# Patient Record
Sex: Male | Born: 1935 | Race: White | Hispanic: No | Smoking: Former smoker
Health system: Southern US, Community
[De-identification: ages and names within clinical notes are randomized; demographics above are authoritative.]

## PROBLEM LIST (undated history)

## (undated) DIAGNOSIS — I10 Essential (primary) hypertension: Secondary | ICD-10-CM

## (undated) DIAGNOSIS — L570 Actinic keratosis: Secondary | ICD-10-CM

## (undated) DIAGNOSIS — R32 Unspecified urinary incontinence: Secondary | ICD-10-CM

## (undated) DIAGNOSIS — M1990 Unspecified osteoarthritis, unspecified site: Secondary | ICD-10-CM

## (undated) DIAGNOSIS — R55 Syncope and collapse: Secondary | ICD-10-CM

## (undated) DIAGNOSIS — Z972 Presence of dental prosthetic device (complete) (partial): Secondary | ICD-10-CM

## (undated) DIAGNOSIS — Z8719 Personal history of other diseases of the digestive system: Secondary | ICD-10-CM

## (undated) DIAGNOSIS — C61 Malignant neoplasm of prostate: Secondary | ICD-10-CM

## (undated) HISTORY — PX: OTHER SURGICAL HISTORY: SHX169

## (undated) HISTORY — PX: PROSTATE SURGERY: SHX751

## (undated) HISTORY — DX: Actinic keratosis: L57.0

---

## 1998-04-08 ENCOUNTER — Encounter: Admission: RE | Admit: 1998-04-08 | Discharge: 1998-07-07 | Payer: Self-pay | Admitting: Radiation Oncology

## 1998-04-15 ENCOUNTER — Encounter: Payer: Self-pay | Admitting: Radiation Oncology

## 2003-06-10 ENCOUNTER — Ambulatory Visit (HOSPITAL_COMMUNITY): Admission: RE | Admit: 2003-06-10 | Discharge: 2003-06-10 | Payer: Self-pay | Admitting: *Deleted

## 2010-06-27 DIAGNOSIS — C4491 Basal cell carcinoma of skin, unspecified: Secondary | ICD-10-CM

## 2010-06-27 HISTORY — DX: Basal cell carcinoma of skin, unspecified: C44.91

## 2012-11-25 DIAGNOSIS — C4492 Squamous cell carcinoma of skin, unspecified: Secondary | ICD-10-CM

## 2012-11-25 HISTORY — DX: Squamous cell carcinoma of skin, unspecified: C44.92

## 2013-06-27 HISTORY — PX: COLONOSCOPY: SHX174

## 2013-08-26 ENCOUNTER — Ambulatory Visit: Payer: Self-pay | Admitting: Gastroenterology

## 2016-08-15 ENCOUNTER — Other Ambulatory Visit: Payer: Self-pay | Admitting: Gastroenterology

## 2016-08-15 DIAGNOSIS — R1310 Dysphagia, unspecified: Secondary | ICD-10-CM

## 2016-08-19 ENCOUNTER — Ambulatory Visit
Admission: RE | Admit: 2016-08-19 | Discharge: 2016-08-19 | Disposition: A | Discharge status: Home / Self Care | Payer: Medicare Other | Source: Ambulatory Visit | Attending: Gastroenterology | Admitting: Gastroenterology

## 2016-08-19 DIAGNOSIS — K449 Diaphragmatic hernia without obstruction or gangrene: Secondary | ICD-10-CM | POA: Insufficient documentation

## 2016-08-19 DIAGNOSIS — R1310 Dysphagia, unspecified: Secondary | ICD-10-CM | POA: Insufficient documentation

## 2016-08-19 DIAGNOSIS — M2578 Osteophyte, vertebrae: Secondary | ICD-10-CM | POA: Insufficient documentation

## 2016-08-19 DIAGNOSIS — K219 Gastro-esophageal reflux disease without esophagitis: Secondary | ICD-10-CM | POA: Insufficient documentation

## 2016-09-15 ENCOUNTER — Encounter: Payer: Self-pay | Admitting: Emergency Medicine

## 2016-09-15 ENCOUNTER — Emergency Department: Payer: Medicare Other

## 2016-09-15 ENCOUNTER — Emergency Department
Admission: EM | Admit: 2016-09-15 | Discharge: 2016-09-15 | Disposition: A | Discharge status: Home / Self Care | Payer: Medicare Other | Attending: Emergency Medicine | Admitting: Emergency Medicine

## 2016-09-15 DIAGNOSIS — R0602 Shortness of breath: Secondary | ICD-10-CM | POA: Diagnosis not present

## 2016-09-15 DIAGNOSIS — Z87891 Personal history of nicotine dependence: Secondary | ICD-10-CM | POA: Insufficient documentation

## 2016-09-15 DIAGNOSIS — I10 Essential (primary) hypertension: Secondary | ICD-10-CM | POA: Diagnosis not present

## 2016-09-15 DIAGNOSIS — Z79899 Other long term (current) drug therapy: Secondary | ICD-10-CM | POA: Insufficient documentation

## 2016-09-15 DIAGNOSIS — J101 Influenza due to other identified influenza virus with other respiratory manifestations: Secondary | ICD-10-CM

## 2016-09-15 DIAGNOSIS — Z8546 Personal history of malignant neoplasm of prostate: Secondary | ICD-10-CM | POA: Diagnosis not present

## 2016-09-15 DIAGNOSIS — Z7982 Long term (current) use of aspirin: Secondary | ICD-10-CM | POA: Diagnosis not present

## 2016-09-15 DIAGNOSIS — E869 Volume depletion, unspecified: Secondary | ICD-10-CM | POA: Diagnosis not present

## 2016-09-15 DIAGNOSIS — R42 Dizziness and giddiness: Secondary | ICD-10-CM

## 2016-09-15 HISTORY — DX: Unspecified urinary incontinence: R32

## 2016-09-15 HISTORY — DX: Unspecified osteoarthritis, unspecified site: M19.90

## 2016-09-15 HISTORY — DX: Malignant neoplasm of prostate: C61

## 2016-09-15 HISTORY — DX: Essential (primary) hypertension: I10

## 2016-09-15 HISTORY — DX: Syncope and collapse: R55

## 2016-09-15 LAB — CBC WITH DIFFERENTIAL/PLATELET
BASOS PCT: 0 %
Basophils Absolute: 0 10*3/uL (ref 0–0.1)
EOS ABS: 0.1 10*3/uL (ref 0–0.7)
Eosinophils Relative: 2 %
HCT: 46.1 % (ref 40.0–52.0)
HEMOGLOBIN: 15.5 g/dL (ref 13.0–18.0)
Lymphocytes Relative: 10 %
Lymphs Abs: 0.7 10*3/uL — ABNORMAL LOW (ref 1.0–3.6)
MCH: 29.6 pg (ref 26.0–34.0)
MCHC: 33.6 g/dL (ref 32.0–36.0)
MCV: 88.2 fL (ref 80.0–100.0)
Monocytes Absolute: 1 10*3/uL (ref 0.2–1.0)
Monocytes Relative: 13 %
NEUTROS PCT: 75 %
Neutro Abs: 5.7 10*3/uL (ref 1.4–6.5)
Platelets: 184 10*3/uL (ref 150–440)
RBC: 5.23 MIL/uL (ref 4.40–5.90)
RDW: 14.7 % — ABNORMAL HIGH (ref 11.5–14.5)
WBC: 7.6 10*3/uL (ref 3.8–10.6)

## 2016-09-15 LAB — COMPREHENSIVE METABOLIC PANEL
ALBUMIN: 3.7 g/dL (ref 3.5–5.0)
ALT: 22 U/L (ref 17–63)
ANION GAP: 8 (ref 5–15)
AST: 24 U/L (ref 15–41)
Alkaline Phosphatase: 57 U/L (ref 38–126)
BILIRUBIN TOTAL: 1.1 mg/dL (ref 0.3–1.2)
BUN: 14 mg/dL (ref 6–20)
CO2: 24 mmol/L (ref 22–32)
Calcium: 8.5 mg/dL — ABNORMAL LOW (ref 8.9–10.3)
Chloride: 104 mmol/L (ref 101–111)
Creatinine, Ser: 0.89 mg/dL (ref 0.61–1.24)
GFR calc Af Amer: >60 mL/min (ref >60)
GFR calc non Af Amer: >60 mL/min (ref >60)
GLUCOSE: 109 mg/dL — AB (ref 65–99)
POTASSIUM: 4 mmol/L (ref 3.5–5.1)
SODIUM: 136 mmol/L (ref 135–145)
TOTAL PROTEIN: 7.4 g/dL (ref 6.5–8.1)

## 2016-09-15 LAB — MAGNESIUM: Magnesium: 1.9 mg/dL (ref 1.7–2.4)

## 2016-09-15 LAB — INFLUENZA PANEL BY PCR (TYPE A & B)
INFLAPCR: NEGATIVE
INFLBPCR: POSITIVE — AB

## 2016-09-15 LAB — BRAIN NATRIURETIC PEPTIDE: B NATRIURETIC PEPTIDE 5: 48 pg/mL (ref 0.0–100.0)

## 2016-09-15 LAB — LIPASE, BLOOD: Lipase: 13 U/L (ref 11–51)

## 2016-09-15 LAB — TROPONIN I

## 2016-09-15 MED ORDER — OSELTAMIVIR PHOSPHATE 75 MG PO CAPS: 75.0000 mg | ORAL_CAPSULE | Freq: Two times a day (BID) | ORAL | 0 refills | Status: AC

## 2016-09-15 MED ORDER — HYDROCOD POLST-CPM POLST ER 10-8 MG/5ML PO SUER: 5.0000 mL | Freq: Two times a day (BID) | ORAL | 0 refills | Status: DC

## 2016-09-15 MED ORDER — SODIUM CHLORIDE 0.9 % IV BOLUS (SEPSIS)
500.0000 mL | INTRAVENOUS | Status: AC
  Administered 2016-09-15: 500 mL via INTRAVENOUS

## 2016-09-15 MED ORDER — ACETAMINOPHEN 500 MG PO TABS
1000.0000 mg | ORAL_TABLET | Freq: Once | ORAL | Status: AC
  Administered 2016-09-15: 1000 mg via ORAL
  Filled 2016-09-15: qty 2

## 2016-09-15 NOTE — ED Provider Notes (Signed)
[facility] Emergency Department Provider Note  ____________________________________________   None    (approximate)  I have reviewed the triage vital signs and the nursing notes.   HISTORY  Chief Complaint URI symptoms, shortness of breath, lightheadedness   HPI Trevor Carlson is a 82 y.o. male who is generally quite healthy and rarely goes to his primary care doctor, Dr. Ginette Pitman, nor the emergency department, who presents by private vehicle for evaluation of shortness of breath, cough, sinus congestion, and some dizziness starting this morning.  He states that he has felt a gradual onset of the symptoms which are moderate in severity and nothing makes them better or worse.  This onset has been over the last several days.  He has also had no appetite or interest in eating so he has had significantly decreased by mouth intake over that time.  When he tried to get up this morning and set up from his bed he became quite lightheaded and dizzy.  At that point his daughter convinced him to come to the emergency department.  He reports some "low-grade fevers" of 99-100.  He feels sinus congestion, watery eyes, runny nose, productive cough, and shortness of breath that is worse with exertion.  He denies chest pain, abdominal pain, nausea, vomiting, diarrhea, dysuria.  He has had a decreased appetite.  He is ambulatory without difficulty and no longer feels dizzy or lightheaded.    Past Medical History:  Diagnosis Date  . Arthritis   . Hypertension   . Pre-syncope   . Prostate cancer ([place])   . Urinary incontinence     There are no active problems to display for this patient.   Past Surgical History:  Procedure Laterality Date  . COLONOSCOPY  2015  . PROSTATE SURGERY      Prior to Admission medications   Not on File    Allergies Patient has no known allergies.  History reviewed. No pertinent family history.  Social History Social History  Substance Use  Topics  . Smoking status: Former Research scientist (life sciences)  . Smokeless tobacco: Never Used  . Alcohol use No    Review of Systems Constitutional: "Low grade" fevers, no chills Eyes: No visual changes. ENT: Nasal congestion, runny nose Cardiovascular: Denies chest pain. Respiratory: +shortness of breath, productive cough Gastrointestinal: No abdominal pain.  No nausea, no vomiting.  No diarrhea.  No constipation.  Decreased appetite and oral intake for several days Genitourinary: Negative for dysuria. Musculoskeletal: Negative for back pain. Skin: Negative for rash. Neurological: Negative for headaches, focal weakness or numbness.  10-point ROS otherwise negative.  ____________________________________________   PHYSICAL EXAM:  VITAL SIGNS: ED Triage Vitals  Enc Vitals Group     BP 09/15/16 0603 (!) 161/76     Pulse Rate 09/15/16 0603 (!) 115     Resp 09/15/16 0603 18     Temp 09/15/16 0603 99.4 F (37.4 C)     Temp Source 09/15/16 0603 Oral     SpO2 09/15/16 0603 96 %     Weight 09/15/16 0601 200 lb (90.7 kg)     Height 09/15/16 0601 6' (1.829 m)     Head Circumference --      Peak Flow --      Pain Score 09/15/16 0601 8     Pain Loc --      Pain Edu? --      Excl. in [place]? --     Constitutional: Alert and oriented. Well appearing and in no acute  distress. Eyes: Conjunctivae are normal. PERRL. EOMI. Head: Atraumatic. Nose: No congestion/rhinnorhea. Mouth/Throat: Mucous membranes are moist. Neck: No stridor.  No meningeal signs.   Cardiovascular: Normal rate, regular rhythm. Good peripheral circulation. Grossly normal heart sounds. Respiratory: Normal respiratory effort.  No retractions. Lungs CTAB. Gastrointestinal: Soft and nontender. No distention.  Musculoskeletal: No lower extremity tenderness nor edema. No gross deformities of extremities. Neurologic:  Normal speech and language. No gross focal neurologic deficits are appreciated.  Skin:  Skin is warm, dry and intact. No rash  noted. Psychiatric: Mood and affect are normal. Speech and behavior are normal.  ____________________________________________   LABS (all labs ordered are listed, but only abnormal results are displayed)  Labs Reviewed  CBC WITH DIFFERENTIAL/PLATELET - Abnormal; Notable for the following:       Result Value   RDW 14.7 (*)    Lymphs Abs 0.7 (*)    All other components within normal limits  COMPREHENSIVE METABOLIC PANEL - Abnormal; Notable for the following:    Glucose, Bld 109 (*)    Calcium 8.5 (*)    All other components within normal limits  TROPONIN I  MAGNESIUM  LIPASE, BLOOD  BRAIN NATRIURETIC PEPTIDE   ____________________________________________  EKG  ED ECG REPORT I, Shanetta Nicolls, the attending physician, personally viewed and interpreted this ECG.  Date: 09/15/2016 EKG Time: 6:26 AM Rate: 106 Rhythm: Sinus tachycardia QRS Axis: normal Intervals: normal ST/T Wave abnormalities: normal Conduction Disturbances: none Narrative Interpretation: Non-specific ST segment / T-wave changes, but no evidence of acute ischemia.  ____________________________________________  RADIOLOGY   No results found.  ____________________________________________   PROCEDURES  Critical Care performed: No   Procedure(s) performed:   Procedures   ____________________________________________   INITIAL IMPRESSION / ASSESSMENT AND PLAN / ED COURSE  Pertinent labs & imaging results that were available during my care of the patient were reviewed by me and considered in my medical decision making (see chart for details).  The patient is well-appearing and in no acute distress.  However, he has a very slightly elevated temperature which may be relevant for a 82 year old.  More importantly he has a heart rate of 115.  This is a man who rarely goes to the doctor, is quite stoic, and is quite healthy for his age.  He has no emergency department visits within the last 6  months.  His complaint of chest pain specifically prompts me to evaluate him further.  I suspect the tachycardia is due to volume depletion due to not eating or drinking well for the last several days in the setting of a viral illness.  However I will evaluate with a full set of labs including troponin and EKG as well as a chest x-ray to rule out any pneumonia, metabolic derangement, or ACS (rule out the best of my ability).  I will give him a small fluid bolus as I suspect that his lightheadedness and dizziness is an  orthostatic issue and he does have a history of "presyncope".  Patient and daughter agree with the plan.  Clinical Course as of Sep 15 700  Thu Sep 15, 2016  0654 CBC and EKG reassuring.  BNP, troponin, CMP, lipase, magnesium, CXR pending.  [CF]  0700 Transferring ED care to Dr. Jimmye Norman to follow up his workup as described and determine disposition.  If patient feels better after fluids with reassuring labs, anticipate discharge and outpatient follow up  [CF]    Clinical Course User Index [CF] Hinda Kehr, MD  ____________________________________________  FINAL CLINICAL IMPRESSION(S) / ED DIAGNOSES  Final diagnoses:  Viral URI with cough  Lightheadedness  Volume depletion     MEDICATIONS GIVEN DURING THIS VISIT:  Medications  sodium chloride 0.9 % bolus 500 mL (500 mLs Intravenous New Bag/Given 09/15/16 2458)     NEW OUTPATIENT MEDICATIONS STARTED DURING THIS VISIT:  New Prescriptions   No medications on file    Modified Medications   No medications on file    Discontinued Medications   No medications on file     Note:  This document was prepared using Dragon voice recognition software and may include unintentional dictation errors.    Hinda Kehr, MD 09/15/16 [phone]

## 2016-09-15 NOTE — ED Notes (Signed)
XR called to come for patient.

## 2016-09-15 NOTE — Discharge Instructions (Addendum)

## 2016-09-15 NOTE — ED Provider Notes (Signed)
Patient tested positive for influenza B. He'll be treated with Tamiflu, Tussionex and be referred for outpatient follow-up.   Earleen Newport, MD 09/15/16 [phone]

## 2016-09-15 NOTE — ED Notes (Signed)
MD in room to reassess patient.

## 2016-09-15 NOTE — ED Triage Notes (Signed)
Patient ambulatory to triage with steady gait, without difficulty or distress noted; pt reports since weekend having sinus congestion, prod cough gray sputum, sinus pressure and HA

## 2016-12-05 ENCOUNTER — Encounter: Payer: Self-pay | Admitting: *Deleted

## 2016-12-06 ENCOUNTER — Ambulatory Visit
Admission: RE | Admit: 2016-12-06 | Discharge: 2016-12-06 | Disposition: A | Discharge status: Home / Self Care | Payer: Medicare Other | Source: Ambulatory Visit | Attending: Gastroenterology | Admitting: Gastroenterology

## 2016-12-06 ENCOUNTER — Ambulatory Visit: Payer: Medicare Other | Admitting: Anesthesiology

## 2016-12-06 ENCOUNTER — Encounter
Admission: RE | Disposition: A | Discharge status: Home / Self Care | Payer: Self-pay | Source: Ambulatory Visit | Attending: Gastroenterology

## 2016-12-06 DIAGNOSIS — K449 Diaphragmatic hernia without obstruction or gangrene: Secondary | ICD-10-CM | POA: Diagnosis not present

## 2016-12-06 DIAGNOSIS — K2980 Duodenitis without bleeding: Secondary | ICD-10-CM | POA: Insufficient documentation

## 2016-12-06 DIAGNOSIS — Z7982 Long term (current) use of aspirin: Secondary | ICD-10-CM | POA: Diagnosis not present

## 2016-12-06 DIAGNOSIS — Z79899 Other long term (current) drug therapy: Secondary | ICD-10-CM | POA: Diagnosis not present

## 2016-12-06 DIAGNOSIS — R1310 Dysphagia, unspecified: Secondary | ICD-10-CM | POA: Diagnosis not present

## 2016-12-06 DIAGNOSIS — I10 Essential (primary) hypertension: Secondary | ICD-10-CM | POA: Insufficient documentation

## 2016-12-06 DIAGNOSIS — M1711 Unilateral primary osteoarthritis, right knee: Secondary | ICD-10-CM | POA: Diagnosis not present

## 2016-12-06 DIAGNOSIS — Z87891 Personal history of nicotine dependence: Secondary | ICD-10-CM | POA: Insufficient documentation

## 2016-12-06 DIAGNOSIS — Q398 Other congenital malformations of esophagus: Secondary | ICD-10-CM | POA: Insufficient documentation

## 2016-12-06 DIAGNOSIS — Z8546 Personal history of malignant neoplasm of prostate: Secondary | ICD-10-CM | POA: Insufficient documentation

## 2016-12-06 HISTORY — DX: Personal history of other diseases of the digestive system: Z87.19

## 2016-12-06 HISTORY — PX: ESOPHAGOGASTRODUODENOSCOPY (EGD) WITH PROPOFOL: SHX5813

## 2016-12-06 SURGERY — ESOPHAGOGASTRODUODENOSCOPY (EGD) WITH PROPOFOL
Anesthesia: General

## 2016-12-06 MED ORDER — LIDOCAINE 2% (20 MG/ML) 5 ML SYRINGE
INTRAMUSCULAR | Status: DC | PRN
  Administered 2016-12-06: 40 mg via INTRAVENOUS

## 2016-12-06 MED ORDER — FENTANYL CITRATE (PF) 100 MCG/2ML IJ SOLN
INTRAMUSCULAR | Status: AC
  Filled 2016-12-06: qty 2

## 2016-12-06 MED ORDER — FENTANYL CITRATE (PF) 100 MCG/2ML IJ SOLN
INTRAMUSCULAR | Status: DC | PRN
  Administered 2016-12-06 (×2): 50 ug via INTRAVENOUS

## 2016-12-06 MED ORDER — SODIUM CHLORIDE 0.9 % IV SOLN
INTRAVENOUS | Status: DC
  Administered 2016-12-06: 1000 mL via INTRAVENOUS

## 2016-12-06 MED ORDER — PROPOFOL 10 MG/ML IV BOLUS
INTRAVENOUS | Status: DC | PRN
  Administered 2016-12-06: 100 mg via INTRAVENOUS

## 2016-12-06 MED ORDER — SODIUM CHLORIDE 0.9 % IV SOLN: INTRAVENOUS | Status: DC

## 2016-12-06 MED ORDER — PROPOFOL 500 MG/50ML IV EMUL
INTRAVENOUS | Status: DC | PRN
  Administered 2016-12-06: 160 ug/kg/min via INTRAVENOUS

## 2016-12-06 MED ORDER — PROPOFOL 10 MG/ML IV BOLUS
INTRAVENOUS | Status: AC
Start: 2016-12-06 — End: 2016-12-06
  Filled 2016-12-06: qty 20

## 2016-12-06 MED ORDER — PROPOFOL 500 MG/50ML IV EMUL
INTRAVENOUS | Status: AC
  Filled 2016-12-06: qty 50

## 2016-12-06 MED ORDER — PHENYLEPHRINE HCL 10 MG/ML IJ SOLN
INTRAMUSCULAR | Status: DC | PRN
  Administered 2016-12-06: 100 ug via INTRAVENOUS

## 2016-12-06 MED ORDER — MIDAZOLAM HCL 2 MG/2ML IJ SOLN
INTRAMUSCULAR | Status: AC
  Filled 2016-12-06: qty 2

## 2016-12-06 MED ORDER — EPHEDRINE SULFATE 50 MG/ML IJ SOLN
INTRAMUSCULAR | Status: DC | PRN
  Administered 2016-12-06: 5 mg via INTRAVENOUS

## 2016-12-06 NOTE — H&P (Signed)
Outpatient short stay form Pre-procedure 12/06/2016 8:Trevor AM Lollie Sails MD  Primary Physician: Dr Tracie Harrier  Reason for visit:  EGD  History of present illness:  Patient is a 82 year old Carlson presenting today as above. He has a history of some dysphagia. He had a barium swallow recently that indicated some cervical osteophytes also a prominent B ring. His symptoms have improved while taking a proton pump inhibitor. He does take a daily aspirin that has been held for 2 days. He takes no other aspirin or blood thinning agents.    Current Facility-Administered Medications:  .  0.9 %  sodium chloride infusion, , Intravenous, Continuous, Lollie Sails, MD, Last Rate: 20 mL/hr at 12/06/16 0755, 1,000 mL at 12/06/16 0755 .  0.9 %  sodium chloride infusion, , Intravenous, Continuous, Lollie Sails, MD  Prescriptions Prior to Admission  Medication Sig Dispense Refill Last Dose  . albuterol (PROVENTIL HFA;VENTOLIN HFA) 108 (90 Base) MCG/ACT inhaler Inhale 1-2 puffs into the lungs every 6 (six) hours as needed for wheezing or shortness of breath.   Past Week at Unknown time  . aspirin EC 81 MG tablet Take 81 mg by mouth daily.   Past Week at Unknown time  . calcium citrate-vitamin D (CITRACAL+D) 315-200 MG-UNIT tablet Take 1 tablet by mouth 2 (two) times daily.   Past Week at Unknown time  . chlorpheniramine-HYDROcodone (TUSSIONEX PENNKINETIC ER) 10-8 MG/5ML SUER Take 5 mLs by mouth 2 (two) times daily. 140 mL 0 Past Week at Unknown time  . Coenzyme Q10 (CO Q-10) 100 MG CAPS Take 1 capsule by mouth daily.   Past Week at Unknown time  . glucosamine-chondroitin 500-400 MG tablet Take 1 tablet by mouth 2 (two) times daily.   Past Week at Unknown time  . losartan (COZAAR) 50 MG tablet Take 50 mg by mouth daily.   Past Week at Unknown time  . Multiple Vitamin (MULTIVITAMIN) tablet Take 1 tablet by mouth daily.   Past Week at Unknown time  . naproxen sodium (ANAPROX) 220 MG tablet Take  220 mg by mouth 2 (two) times daily with a meal.   Past Week at Unknown time  . OMEGA-3 FATTY ACIDS PO Take 1,000 mg by mouth daily.   Past Week at Unknown time  . omeprazole (PRILOSEC) 40 MG capsule Take 40 mg by mouth daily.   Past Week at Unknown time  . Plant Sterols and Stanols (CHOLESTOFF PLUS PO) Take 2 tablets by mouth 2 (two) times daily.   Past Week at Unknown time  . vitamin C (ASCORBIC ACID) 500 MG tablet Take 500 mg by mouth daily.   Past Week at Unknown time     No Known Allergies   Past Medical History:  Diagnosis Date  . Arthritis    osteoarthritis right knee  . History of hiatal hernia   . Hypertension   . Pre-syncope   . Prostate cancer ([place])   . Urinary incontinence     Review of systems:      Physical Exam    Heart and lungs: Regular rate and rhythm without rub or gallop, lungs are bilaterally clear.    HEENT: Normocephalic atraumatic eyes are anicteric    Other:     Pertinant exam for procedure: Soft nontender nondistended bowel sounds positive normoactive.    Planned proceedures: EGD and indicated procedures. I have discussed the risks benefits and complications of procedures to include not limited to bleeding, infection, perforation and the risk of sedation  and the patient wishes to proceed.    Lollie Sails, MD Gastroenterology 12/06/2016  8:Trevor AM

## 2016-12-06 NOTE — Anesthesia Postprocedure Evaluation (Signed)
Anesthesia Post Note  Patient: Trevor Carlson  Procedure(s) Performed: Procedure(s) (LRB): ESOPHAGOGASTRODUODENOSCOPY (EGD) WITH PROPOFOL (N/A)  Patient location during evaluation: PACU Anesthesia Type: General Level of consciousness: awake Pain management: pain level controlled Vital Signs Assessment: post-procedure vital signs reviewed and stable Respiratory status: spontaneous breathing Cardiovascular status: stable Anesthetic complications: no     Last Vitals:  Vitals:   12/06/16 0943 12/06/16 0953  BP: 119/68 121/74  Pulse: 69 71  Resp: 19 20  Temp:      Last Pain:  Vitals:   12/06/16 0923  TempSrc: Tympanic                 VAN STAVEREN,Ahaana Rochette

## 2016-12-06 NOTE — Anesthesia Post-op Follow-up Note (Cosign Needed)
Anesthesia QCDR form completed.        

## 2016-12-06 NOTE — Anesthesia Preprocedure Evaluation (Signed)
Anesthesia Evaluation  Patient identified by MRN, date of birth, ID band Patient awake    Reviewed: Allergy & Precautions, NPO status , Patient's Chart, lab work & pertinent test results  Airway Mallampati: II       Dental  (+) Teeth Intact   Pulmonary neg pulmonary ROS, former smoker,    breath sounds clear to auscultation       Cardiovascular Exercise Tolerance: Good hypertension, Pt. on medications  Rhythm:Regular     Neuro/Psych negative neurological ROS  negative psych ROS   GI/Hepatic Neg liver ROS, hiatal hernia,   Endo/Other  negative endocrine ROS  Renal/GU negative Renal ROS     Musculoskeletal negative musculoskeletal ROS (+)   Abdominal Normal abdominal exam  (+)   Peds negative pediatric ROS (+)  Hematology negative hematology ROS (+)   Anesthesia Other Findings   Reproductive/Obstetrics                             Anesthesia Physical Anesthesia Plan  ASA: II  Anesthesia Plan: General   Post-op Pain Management:    Induction: Intravenous  PONV Risk Score and Plan: 0  Airway Management Planned: Natural Airway  Additional Equipment:   Intra-op Plan:   Post-operative Plan:   Informed Consent: I have reviewed the patients History and Physical, chart, labs and discussed the procedure including the risks, benefits and alternatives for the proposed anesthesia with the patient or authorized representative who has indicated his/her understanding and acceptance.     Plan Discussed with: CRNA  Anesthesia Plan Comments:         Anesthesia Quick Evaluation

## 2016-12-06 NOTE — Op Note (Signed)
[facility] Gastroenterology Patient Name: Trevor Carlson Procedure Date: 12/06/2016 8:42 AM MRN: [id number] Account #: [id number] Date of Birth: 1934-08-28 Admit Type: Outpatient Age: 82 Room: [facility] ENDO ROOM 3 Gender: Male Note Status: Finalized Procedure:            Upper GI endoscopy Indications:          Dysphagia Providers:            Lollie Sails, MD Referring MD:         Tracie Harrier, MD (Referring MD) Medicines:            Monitored Anesthesia Care Complications:        No immediate complications. Procedure:            Pre-Anesthesia Assessment:                       - ASA Grade Assessment: II - A patient with mild                        systemic disease.                       After obtaining informed consent, the endoscope was                        passed under direct vision. Throughout the procedure,                        the patient's blood pressure, pulse, and oxygen                        saturations were monitored continuously. The Endoscope                        was introduced through the mouth, and advanced to the                        third part of duodenum. The upper GI endoscopy was                        accomplished without difficulty. The patient tolerated                        the procedure well. Findings:      The lower third of the esophagus was moderately tortuous.      LA Grade D (one or more mucosal breaks involving at least 75% of       esophageal circumference) esophagitis with no bleeding was found.       Biopsies were taken with a cold forceps for histology.      A small hiatal hernia was present.      Diffuse moderate inflammation characterized by congestion (edema),       erythema and friability was found in the entire examined stomach.       Biopsies were taken with a cold forceps for histology. Biopsies were       taken with a cold forceps for Helicobacter pylori testing.      The cardia and gastric fundus were  normal on retroflexion otherwise.      Diffuse and patchy mild inflammation characterized by congestion       (edema), erythema and  granularity was found in the duodenal bulb and in       the second portion of the duodenum. Impression:           - Tortuous esophagus.                       - LA Grade D erosive esophagitis. Biopsied.                       - Small hiatal hernia.                       - Bile gastritis. Biopsied.                       - Duodenitis. Recommendation:       - Use Protonix (pantoprazole) 40 mg PO BID for 6 weeks.                       - Use Protonix (pantoprazole) 40 mg PO daily daily.                       - Use sucralfate tablets 1 gram PO QID for 1 month.                       - Use sucralfate tablets 1 gram PO BID. Procedure Code(s):    --- Professional ---                       [phone], Esophagogastroduodenoscopy, flexible, transoral;                        with biopsy, single or multiple Diagnosis Code(s):    --- Professional ---                       Q39.9, Congenital malformation of esophagus, unspecified                       K20.8, Other esophagitis                       K44.9, Diaphragmatic hernia without obstruction or                        gangrene                       K29.60, Other gastritis without bleeding                       K29.80, Duodenitis without bleeding                       R13.10, Dysphagia, unspecified CPT copyright 2016 American Medical Association. All rights reserved. The codes documented in this report are preliminary and upon coder review may  be revised to meet current compliance requirements. Lollie Sails, MD 12/06/2016 9:24:06 AM This report has been signed electronically. Number of Addenda: 0 Note Initiated On: 12/06/2016 8:42 AM      [facility]

## 2016-12-06 NOTE — Transfer of Care (Signed)
Immediate Anesthesia Transfer of Care Note  Patient: Trevor Carlson  Procedure(s) Performed: Procedure(s): ESOPHAGOGASTRODUODENOSCOPY (EGD) WITH PROPOFOL (N/A)  Patient Location: PACU and Endoscopy Unit  Anesthesia Type:General  Level of Consciousness: sedated  Airway & Oxygen Therapy: Patient Spontanous Breathing and Patient connected to nasal cannula oxygen  Post-op Assessment: Report given to RN and Post -op Vital signs reviewed and stable  Post vital signs: Reviewed and stable  Last Vitals:  Vitals:   12/06/16 0744  BP: (!) 141/100  Pulse: 79  Resp: 16  Temp: 36.1 C    Last Pain: There were no vitals filed for this visit.       Complications: No apparent anesthesia complications

## 2016-12-07 ENCOUNTER — Encounter: Payer: Self-pay | Admitting: Gastroenterology

## 2016-12-08 LAB — SURGICAL PATHOLOGY

## 2017-01-09 ENCOUNTER — Encounter: Payer: Self-pay | Admitting: *Deleted

## 2017-01-10 ENCOUNTER — Ambulatory Visit: Payer: Medicare Other | Admitting: Anesthesiology

## 2017-01-10 ENCOUNTER — Encounter
Admission: RE | Disposition: A | Discharge status: Home / Self Care | Payer: Self-pay | Source: Ambulatory Visit | Attending: Gastroenterology

## 2017-01-10 ENCOUNTER — Ambulatory Visit
Admission: RE | Admit: 2017-01-10 | Discharge: 2017-01-10 | Disposition: A | Discharge status: Home / Self Care | Payer: Medicare Other | Source: Ambulatory Visit | Attending: Gastroenterology | Admitting: Gastroenterology

## 2017-01-10 ENCOUNTER — Encounter: Payer: Self-pay | Admitting: *Deleted

## 2017-01-10 DIAGNOSIS — J45909 Unspecified asthma, uncomplicated: Secondary | ICD-10-CM | POA: Diagnosis not present

## 2017-01-10 DIAGNOSIS — Z79899 Other long term (current) drug therapy: Secondary | ICD-10-CM | POA: Diagnosis not present

## 2017-01-10 DIAGNOSIS — Z7982 Long term (current) use of aspirin: Secondary | ICD-10-CM | POA: Insufficient documentation

## 2017-01-10 DIAGNOSIS — Z8546 Personal history of malignant neoplasm of prostate: Secondary | ICD-10-CM | POA: Diagnosis not present

## 2017-01-10 DIAGNOSIS — K449 Diaphragmatic hernia without obstruction or gangrene: Secondary | ICD-10-CM | POA: Diagnosis not present

## 2017-01-10 DIAGNOSIS — Z87891 Personal history of nicotine dependence: Secondary | ICD-10-CM | POA: Insufficient documentation

## 2017-01-10 DIAGNOSIS — M1711 Unilateral primary osteoarthritis, right knee: Secondary | ICD-10-CM | POA: Diagnosis not present

## 2017-01-10 DIAGNOSIS — K2950 Unspecified chronic gastritis without bleeding: Secondary | ICD-10-CM | POA: Diagnosis not present

## 2017-01-10 DIAGNOSIS — K210 Gastro-esophageal reflux disease with esophagitis: Secondary | ICD-10-CM | POA: Diagnosis not present

## 2017-01-10 DIAGNOSIS — K3189 Other diseases of stomach and duodenum: Secondary | ICD-10-CM | POA: Diagnosis not present

## 2017-01-10 DIAGNOSIS — I10 Essential (primary) hypertension: Secondary | ICD-10-CM | POA: Insufficient documentation

## 2017-01-10 HISTORY — PX: ESOPHAGOGASTRODUODENOSCOPY (EGD) WITH PROPOFOL: SHX5813

## 2017-01-10 SURGERY — ESOPHAGOGASTRODUODENOSCOPY (EGD) WITH PROPOFOL
Anesthesia: General

## 2017-01-10 MED ORDER — PROPOFOL 500 MG/50ML IV EMUL
INTRAVENOUS | Status: DC | PRN
  Administered 2017-01-10: 160 ug/kg/min via INTRAVENOUS

## 2017-01-10 MED ORDER — PROPOFOL 500 MG/50ML IV EMUL
INTRAVENOUS | Status: AC
  Filled 2017-01-10: qty 50

## 2017-01-10 MED ORDER — PROPOFOL 10 MG/ML IV BOLUS
INTRAVENOUS | Status: DC | PRN
  Administered 2017-01-10: 20 mg via INTRAVENOUS
  Administered 2017-01-10: 50 mg via INTRAVENOUS

## 2017-01-10 MED ORDER — SODIUM CHLORIDE 0.9 % IV SOLN
INTRAVENOUS | Status: DC
  Administered 2017-01-10: 1000 mL via INTRAVENOUS

## 2017-01-10 MED ORDER — SODIUM CHLORIDE 0.9 % IV SOLN: INTRAVENOUS | Status: DC

## 2017-01-10 MED ORDER — LIDOCAINE HCL (PF) 2 % IJ SOLN
INTRAMUSCULAR | Status: AC
  Filled 2017-01-10: qty 2

## 2017-01-10 MED ORDER — LIDOCAINE HCL (CARDIAC) 20 MG/ML IV SOLN
INTRAVENOUS | Status: DC | PRN
  Administered 2017-01-10: 60 mg via INTRAVENOUS

## 2017-01-10 NOTE — Transfer of Care (Addendum)
Immediate Anesthesia Transfer of Care Note  Patient: Trevor Carlson  Procedure(s) Performed: Procedure(s): ESOPHAGOGASTRODUODENOSCOPY (EGD) WITH PROPOFOL (N/A)  Patient Location: PACU  Anesthesia Type:General  Level of Consciousness: sedated  Airway & Oxygen Therapy: Patient Spontanous Breathing and Patient connected to nasal cannula oxygen  Post-op Assessment: Report given to RN and Post -op Vital signs reviewed and stable  Post vital signs: Reviewed and stable  Last Vitals:  Vitals:   01/10/17 1316 01/10/17 1530  BP: (!) 160/83 (P) 123/62  Pulse: 85 80  Resp: 20 16  Temp: (!) 35.7 C (!) (P) 36.3 C    Last Pain:  Vitals:   01/10/17 1530  TempSrc: (P) Tympanic      Patients Stated Pain Goal: 1 (07/18/22 1146)  Complications: No apparent anesthesia complications

## 2017-01-10 NOTE — Anesthesia Postprocedure Evaluation (Signed)
Anesthesia Post Note  Patient: Loron Martinique  Procedure(s) Performed: Procedure(s) (LRB): ESOPHAGOGASTRODUODENOSCOPY (EGD) WITH PROPOFOL (N/A)  Patient location during evaluation: Endoscopy Anesthesia Type: General Level of consciousness: awake and alert Pain management: pain level controlled Vital Signs Assessment: post-procedure vital signs reviewed and stable Respiratory status: spontaneous breathing, nonlabored ventilation, respiratory function stable and patient connected to nasal cannula oxygen Cardiovascular status: blood pressure returned to baseline and stable Postop Assessment: no signs of nausea or vomiting Anesthetic complications: no     Last Vitals:  Vitals:   01/10/17 1550 01/10/17 1600  BP: 133/82 (!) 157/84  Pulse: 79 84  Resp: 19 (!) 24  Temp:      Last Pain:  Vitals:   01/10/17 1530  TempSrc: Tympanic                 Ashlin Kreps S

## 2017-01-10 NOTE — Op Note (Signed)
[facility] Gastroenterology Patient Name: Trevor Carlson Procedure Date: 01/10/2017 2:51 PM MRN: [id number] Account #: [id number] Date of Birth: 1934-09-21 Admit Type: Outpatient Age: 82 Room: [facility] ENDO ROOM 3 Gender: Male Note Status: Finalized Procedure:            Upper GI endoscopy Indications:          Follow-up of reflux esophagitis Providers:            Lollie Sails, MD Referring MD:         Tracie Harrier, MD (Referring MD) Medicines:            Monitored Anesthesia Care Complications:        No immediate complications. Procedure:            Pre-Anesthesia Assessment:                       - ASA Grade Assessment: II - A patient with mild                        systemic disease.                       After obtaining informed consent, the endoscope was                        passed under direct vision. Throughout the procedure,                        the patient's blood pressure, pulse, and oxygen                        saturations were monitored continuously. The Endoscope                        was introduced through the mouth, and advanced to the                        fourth part of duodenum. The upper GI endoscopy was                        accomplished without difficulty. The patient tolerated                        the procedure well. Findings:      Mild LA Grade A (one or more mucosal breaks less than 5 mm, not       extending between tops of 2 mucosal folds) esophagitis with no bleeding       was found. Biopsies were taken with a cold forceps for histology.      No ring or stenosis.      Patchy mild inflammation characterized by congestion (edema), erosions       and erythema was found in the gastric body and in the gastric antrum.       Biopsies were taken with a cold forceps for histology. Biopsies were       taken with a cold forceps for Helicobacter pylori testing.      A small to medium hiatal hernia was found. The Z-line was a variable        distance from incisors; the hiatal hernia was sliding. Impression:           -  LA Grade A erosive esophagitis. Biopsied.                       - Erosive gastritis. Biopsied.                       - Small hiatal hernia. Recommendation:       - Await pathology results.                       - Continue present medications.                       - Return to GI clinic in 2 months.                       - Await pathology results. Procedure Code(s):    --- Professional ---                       [phone], Esophagogastroduodenoscopy, flexible, transoral;                        with biopsy, single or multiple Diagnosis Code(s):    --- Professional ---                       K20.8, Other esophagitis                       K29.60, Other gastritis without bleeding                       K44.9, Diaphragmatic hernia without obstruction or                        gangrene                       K21.0, Gastro-esophageal reflux disease with esophagitis CPT copyright 2016 American Medical Association. All rights reserved. The codes documented in this report are preliminary and upon coder review may  be revised to meet current compliance requirements. Lollie Sails, MD 01/10/2017 3:28:08 PM This report has been signed electronically. Number of Addenda: 0 Note Initiated On: 01/10/2017 2:51 PM      [facility]

## 2017-01-10 NOTE — Anesthesia Preprocedure Evaluation (Signed)
Anesthesia Evaluation  Patient identified by MRN, date of birth, ID band Patient awake    Reviewed: Allergy & Precautions  Airway Mallampati: II       Dental  (+) Teeth Intact   Pulmonary asthma , former smoker,    breath sounds clear to auscultation       Cardiovascular Exercise Tolerance: Good hypertension, Pt. on medications  Rhythm:Regular     Neuro/Psych negative neurological ROS  negative psych ROS   GI/Hepatic Neg liver ROS,   Endo/Other  negative endocrine ROS  Renal/GU negative Renal ROS     Musculoskeletal   Abdominal Normal abdominal exam  (+)   Peds  Hematology negative hematology ROS (+)   Anesthesia Other Findings   Reproductive/Obstetrics                             Anesthesia Physical Anesthesia Plan  ASA: II  Anesthesia Plan: General   Post-op Pain Management:    Induction: Intravenous  PONV Risk Score and Plan: 0  Airway Management Planned: Natural Airway and Nasal Cannula  Additional Equipment:   Intra-op Plan:   Post-operative Plan:   Informed Consent: I have reviewed the patients History and Physical, chart, labs and discussed the procedure including the risks, benefits and alternatives for the proposed anesthesia with the patient or authorized representative who has indicated his/her understanding and acceptance.     Plan Discussed with: CRNA  Anesthesia Plan Comments:         Anesthesia Quick Evaluation

## 2017-01-10 NOTE — Anesthesia Post-op Follow-up Note (Cosign Needed)
Anesthesia QCDR form completed.        

## 2017-01-10 NOTE — H&P (Signed)
Outpatient short stay form Pre-procedure 01/10/2017 2:54 PM Lollie Sails MD  Primary Physician: Dr Tracie Harrier  Reason for visit:  EGD  History of present illness:  Patient is a 82 year old male presenting today for follow-up on an EGD done 12/06/2016 for dysphagia. At that time he had a severe grade D erosive esophagitis. There is also small hiatal hernia. There is also some amount of presbyesophagus. He had some bile gastritis. He was treated with twice a day Protonix for 6 weeks and 4 times a day sucralfate for 1 month. Currently he is on Protonix 40 mg once a day and Carafate 1 g twice a day. He states that his dysphagia has resolved. He is presenting for recheck.    Current Facility-Administered Medications:  .  0.9 %  sodium chloride infusion, , Intravenous, Continuous, Lollie Sails, MD, Last Rate: 20 mL/hr at 01/10/17 1331, 1,000 mL at 01/10/17 1331 .  0.9 %  sodium chloride infusion, , Intravenous, Continuous, Lollie Sails, MD  Prescriptions Prior to Admission  Medication Sig Dispense Refill Last Dose  . aspirin EC 81 MG tablet Take 81 mg by mouth daily.   Past Week at Unknown time  . Coenzyme Q10 (CO Q-10) 100 MG CAPS Take 1 capsule by mouth daily.   Past Week at Unknown time  . glucosamine-chondroitin 500-400 MG tablet Take 1 tablet by mouth 2 (two) times daily.   Past Week at Unknown time  . losartan (COZAAR) 50 MG tablet Take 50 mg by mouth daily.   Past Week at Unknown time  . Multiple Vitamin (MULTIVITAMIN) tablet Take 1 tablet by mouth daily.   Past Week at Unknown time  . naproxen sodium (ANAPROX) 220 MG tablet Take 220 mg by mouth 2 (two) times daily with a meal.   Past Week at Unknown time  . OMEGA-3 FATTY ACIDS PO Take 1,000 mg by mouth daily.   Past Week at Unknown time  . pantoprazole (PROTONIX) 40 MG tablet Take 40 mg by mouth daily.   01/09/2017 at Unknown time  . Plant Sterols and Stanols (CHOLESTOFF PLUS PO) Take 2 tablets by mouth 2 (two) times  daily.   Past Week at Unknown time  . sucralfate (CARAFATE) 1 g tablet Take 1 g by mouth 4 (four) times daily -  with meals and at bedtime.   01/09/2017 at Unknown time  . vitamin C (ASCORBIC ACID) 500 MG tablet Take 500 mg by mouth daily.   Past Week at Unknown time  . albuterol (PROVENTIL HFA;VENTOLIN HFA) 108 (90 Base) MCG/ACT inhaler Inhale 1-2 puffs into the lungs every 6 (six) hours as needed for wheezing or shortness of breath.   Not Taking at Unknown time  . calcium citrate-vitamin D (CITRACAL+D) 315-200 MG-UNIT tablet Take 1 tablet by mouth 2 (two) times daily.   Past Week at Unknown time  . chlorpheniramine-HYDROcodone (TUSSIONEX PENNKINETIC ER) 10-8 MG/5ML SUER Take 5 mLs by mouth 2 (two) times daily. 140 mL 0 Past Week at Unknown time  . omeprazole (PRILOSEC) 40 MG capsule Take 40 mg by mouth daily.   Past Week at Unknown time     No Known Allergies   Past Medical History:  Diagnosis Date  . Arthritis    osteoarthritis right knee  . History of hiatal hernia   . Hypertension   . Pre-syncope   . Prostate cancer ([place])   . Urinary incontinence     Review of systems:      Physical Exam  Heart and lungs: Regular rate and rhythm without rub or gallop, lungs are bilaterally clear    HEENT: Normocephalic atraumatic eyes are anicteric    Other:     Pertinant exam for procedure: Soft nontender nondistended bowel sounds positive normoactive.    Planned proceedures: EGD and indicated procedures. I have discussed the risks benefits and complications of procedures to include not limited to bleeding, infection, perforation and the risk of sedation and the patient wishes to proceed.    Lollie Sails, MD Gastroenterology 01/10/2017  2:54 PM

## 2017-01-11 ENCOUNTER — Encounter: Payer: Self-pay | Admitting: Gastroenterology

## 2017-01-12 LAB — SURGICAL PATHOLOGY

## 2019-11-18 ENCOUNTER — Encounter: Payer: Self-pay | Admitting: Dermatology

## 2019-11-18 ENCOUNTER — Other Ambulatory Visit: Payer: Self-pay

## 2019-11-18 ENCOUNTER — Ambulatory Visit: Payer: Medicare Other | Admitting: Dermatology

## 2019-11-18 DIAGNOSIS — D692 Other nonthrombocytopenic purpura: Secondary | ICD-10-CM

## 2019-11-18 DIAGNOSIS — L720 Epidermal cyst: Secondary | ICD-10-CM

## 2019-11-18 DIAGNOSIS — Z85828 Personal history of other malignant neoplasm of skin: Secondary | ICD-10-CM

## 2019-11-18 DIAGNOSIS — L821 Other seborrheic keratosis: Secondary | ICD-10-CM | POA: Diagnosis not present

## 2019-11-18 DIAGNOSIS — L570 Actinic keratosis: Secondary | ICD-10-CM

## 2019-11-18 DIAGNOSIS — Z86007 Personal history of in-situ neoplasm of skin: Secondary | ICD-10-CM | POA: Diagnosis not present

## 2019-11-18 DIAGNOSIS — Z1283 Encounter for screening for malignant neoplasm of skin: Secondary | ICD-10-CM

## 2019-11-18 DIAGNOSIS — L578 Other skin changes due to chronic exposure to nonionizing radiation: Secondary | ICD-10-CM

## 2019-11-18 DIAGNOSIS — L814 Other melanin hyperpigmentation: Secondary | ICD-10-CM

## 2019-11-18 NOTE — Progress Notes (Signed)
Follow-Up Visit   Subjective  Trevor Carlson is a 85 y.o. male who presents for the following: Actinic Keratosis (21m f/u, face, ears) and hx SCCIS (L ear concha, txted 11/2012). Hx BCC L side of nose/eye.  Has bump on back to check.  Not bothersome.   The following portions of the chart were reviewed this encounter and updated as appropriate:      Review of Systems:  No other skin or systemic complaints except as noted in HPI or Assessment and Plan.  Objective  Well appearing patient in no apparent distress; mood and affect are within normal limits.  A focused examination was performed including face, ears, back, arms. Relevant physical exam findings are noted in the Assessment and Plan.  Objective  L posterior ear x 1, L ear helix x 1, R ear helix x 1, R lower cheek x 2, R nasal labial x 1, L nasal tip x 1 (7): Pink scaly macules   Objective  Right spinal upper back: 1.5cm sq nodule  Objective  Left Ear concha: Well healed scar with no evidence of recurrence.   Objective  L paranasal/medial canthus area: Large scar- clear  Objective  Right Preauricular Area: 2.0 x 1.0cm pink tan patch slightly waxy   Assessment & Plan   Actinic Damage - diffuse scaly erythematous macules with underlying dyspigmentation - Recommend daily broad spectrum sunscreen SPF 30+ to sun-exposed areas, reapply every 2 hours as needed.  - Call for new or changing lesions.  Purpura - Violaceous macules and patches - Benign - Related to age, sun damage and/or use of blood thinners - Observe - Can use OTC arnica containing moisturizer such as Dermend Bruise Formula if desired - Call for worsening or other concerns  Seborrheic Keratoses - Stuck-on, waxy, tan-brown papules and plaques  - Discussed benign etiology and prognosis. - Observe - Call for any changes  Lentigines - Scattered tan macules - Discussed due to sun exposure - Benign, observe - Call for any changes  Skin cancer  screening performed today.  AK (actinic keratosis) (7) L posterior ear x 1, L ear helix x 1, R ear helix x 1, R lower cheek x 2, R nasal labial x 1, L nasal tip x 1  Destruction of lesion - L posterior ear x 1, L ear helix x 1, R ear helix x 1, R lower cheek x 2, R nasal labial x 1, L nasal tip x 1  Destruction method: cryotherapy   Informed consent: discussed and consent obtained   Lesion destroyed using liquid nitrogen: Yes   Region frozen until ice ball extended beyond lesion: Yes   Outcome: patient tolerated procedure well with no complications   Post-procedure details: wound care instructions given    Epidermal cyst Right spinal upper back  Reassured benign growth.  Recommend observation.  Discussed surgical excision in office if changes noted or symptomatic.   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 Observe for recurrence. Call clinic for new or changing lesions.  Recommend regular skin exams, daily broad-spectrum spf 30+ sunscreen use, and photoprotection.     Hx of basal cell carcinoma L paranasal/medial canthus area  Clear. Observe for recurrence. Call clinic for new or changing lesions.  Recommend regular skin exams, daily broad-spectrum spf 30+ sunscreen use, and photoprotection.     Seborrheic keratosis Right Preauricular Area  Vs Lentigo - Benign appearing, observe  Return in about 6 months (around  05/20/2020) for Ak f/u.  I, Trevor Carlson, RMA, am acting as scribe for Trevor Patty, MD .  Documentation: I have reviewed the above documentation for accuracy and completeness, and I agree with the above.  Trevor Patty MD

## 2020-05-16 ENCOUNTER — Emergency Department: Payer: Medicare Other

## 2020-05-16 ENCOUNTER — Other Ambulatory Visit: Payer: Self-pay

## 2020-05-16 ENCOUNTER — Inpatient Hospital Stay
Admission: EM | Admit: 2020-05-16 | Discharge: 2020-05-18 | DRG: 391 | Disposition: A | Discharge status: Home / Self Care | Payer: Medicare Other | Attending: Internal Medicine | Admitting: Internal Medicine

## 2020-05-16 DIAGNOSIS — K5731 Diverticulosis of large intestine without perforation or abscess with bleeding: Secondary | ICD-10-CM | POA: Diagnosis present

## 2020-05-16 DIAGNOSIS — I700 Atherosclerosis of aorta: Secondary | ICD-10-CM | POA: Diagnosis present

## 2020-05-16 DIAGNOSIS — Z923 Personal history of irradiation: Secondary | ICD-10-CM

## 2020-05-16 DIAGNOSIS — I10 Essential (primary) hypertension: Secondary | ICD-10-CM | POA: Diagnosis present

## 2020-05-16 DIAGNOSIS — K922 Gastrointestinal hemorrhage, unspecified: Secondary | ICD-10-CM | POA: Diagnosis not present

## 2020-05-16 DIAGNOSIS — K920 Hematemesis: Secondary | ICD-10-CM | POA: Diagnosis not present

## 2020-05-16 DIAGNOSIS — K529 Noninfective gastroenteritis and colitis, unspecified: Secondary | ICD-10-CM

## 2020-05-16 DIAGNOSIS — Z8546 Personal history of malignant neoplasm of prostate: Secondary | ICD-10-CM | POA: Diagnosis not present

## 2020-05-16 DIAGNOSIS — Z9221 Personal history of antineoplastic chemotherapy: Secondary | ICD-10-CM | POA: Diagnosis not present

## 2020-05-16 DIAGNOSIS — Z87891 Personal history of nicotine dependence: Secondary | ICD-10-CM

## 2020-05-16 DIAGNOSIS — K219 Gastro-esophageal reflux disease without esophagitis: Secondary | ICD-10-CM | POA: Diagnosis present

## 2020-05-16 DIAGNOSIS — N281 Cyst of kidney, acquired: Secondary | ICD-10-CM | POA: Diagnosis present

## 2020-05-16 DIAGNOSIS — K921 Melena: Secondary | ICD-10-CM | POA: Diagnosis present

## 2020-05-16 DIAGNOSIS — I160 Hypertensive urgency: Secondary | ICD-10-CM

## 2020-05-16 DIAGNOSIS — Z79899 Other long term (current) drug therapy: Secondary | ICD-10-CM

## 2020-05-16 DIAGNOSIS — K5289 Other specified noninfective gastroenteritis and colitis: Principal | ICD-10-CM | POA: Diagnosis present

## 2020-05-16 DIAGNOSIS — R112 Nausea with vomiting, unspecified: Secondary | ICD-10-CM | POA: Diagnosis not present

## 2020-05-16 DIAGNOSIS — Z7982 Long term (current) use of aspirin: Secondary | ICD-10-CM | POA: Diagnosis not present

## 2020-05-16 DIAGNOSIS — M1711 Unilateral primary osteoarthritis, right knee: Secondary | ICD-10-CM | POA: Diagnosis present

## 2020-05-16 DIAGNOSIS — K5901 Slow transit constipation: Secondary | ICD-10-CM | POA: Diagnosis present

## 2020-05-16 DIAGNOSIS — Z20822 Contact with and (suspected) exposure to covid-19: Secondary | ICD-10-CM | POA: Diagnosis present

## 2020-05-16 DIAGNOSIS — K449 Diaphragmatic hernia without obstruction or gangrene: Secondary | ICD-10-CM | POA: Diagnosis present

## 2020-05-16 DIAGNOSIS — Z85828 Personal history of other malignant neoplasm of skin: Secondary | ICD-10-CM

## 2020-05-16 DIAGNOSIS — Z791 Long term (current) use of non-steroidal anti-inflammatories (NSAID): Secondary | ICD-10-CM

## 2020-05-16 DIAGNOSIS — K5900 Constipation, unspecified: Secondary | ICD-10-CM | POA: Diagnosis not present

## 2020-05-16 DIAGNOSIS — Z23 Encounter for immunization: Secondary | ICD-10-CM | POA: Diagnosis present

## 2020-05-16 LAB — RESP PANEL BY RT-PCR (FLU A&B, COVID) ARPGX2
Influenza A by PCR: NEGATIVE
Influenza B by PCR: NEGATIVE
SARS Coronavirus 2 by RT PCR: NEGATIVE

## 2020-05-16 LAB — COMPREHENSIVE METABOLIC PANEL
ALT: 14 U/L (ref 0–44)
AST: 21 U/L (ref 15–41)
Albumin: 3.9 g/dL (ref 3.5–5.0)
Alkaline Phosphatase: 62 U/L (ref 38–126)
Anion gap: 9 (ref 5–15)
BUN: 16 mg/dL (ref 8–23)
CO2: 24 mmol/L (ref 22–32)
Calcium: 8.6 mg/dL — ABNORMAL LOW (ref 8.9–10.3)
Chloride: 105 mmol/L (ref 98–111)
Creatinine, Ser: 0.93 mg/dL (ref 0.61–1.24)
GFR, Estimated: >60 mL/min (ref >60)
Glucose, Bld: 84 mg/dL (ref 70–99)
Potassium: 4.4 mmol/L (ref 3.5–5.1)
Sodium: 138 mmol/L (ref 135–145)
Total Bilirubin: 0.8 mg/dL (ref 0.3–1.2)
Total Protein: 7.1 g/dL (ref 6.5–8.1)

## 2020-05-16 LAB — TYPE AND SCREEN
ABO/RH(D): O POS
Antibody Screen: NEGATIVE

## 2020-05-16 LAB — CBC
HCT: 44.3 % (ref 39.0–52.0)
Hemoglobin: 15 g/dL (ref 13.0–17.0)
MCH: 30.4 pg (ref 26.0–34.0)
MCHC: 33.9 g/dL (ref 30.0–36.0)
MCV: 89.9 fL (ref 80.0–100.0)
Platelets: 230 10*3/uL (ref 150–400)
RBC: 4.93 MIL/uL (ref 4.22–5.81)
RDW: 14.1 % (ref 11.5–15.5)
WBC: 6.7 10*3/uL (ref 4.0–10.5)
nRBC: 0 % (ref 0.0–0.2)

## 2020-05-16 LAB — PROTIME-INR
INR: 1 (ref 0.8–1.2)
Prothrombin Time: 12.7 seconds (ref 11.4–15.2)

## 2020-05-16 LAB — APTT: aPTT: 29 seconds (ref 24–36)

## 2020-05-16 MED ORDER — SUCRALFATE 1 G PO TABS
1.0000 g | ORAL_TABLET | Freq: Three times a day (TID) | ORAL | Status: DC
  Administered 2020-05-17 – 2020-05-18 (×4): 1 g via ORAL
  Filled 2020-05-16 (×5): qty 1

## 2020-05-16 MED ORDER — MORPHINE SULFATE (PF) 2 MG/ML IV SOLN: 1.0000 mg | INTRAVENOUS | Status: DC | PRN

## 2020-05-16 MED ORDER — PLANT STEROLS AND STANOLS 450 MG PO CAPS: ORAL_CAPSULE | Freq: Two times a day (BID) | ORAL | Status: DC

## 2020-05-16 MED ORDER — ONDANSETRON HCL 4 MG/2ML IJ SOLN
4.0000 mg | Freq: Four times a day (QID) | INTRAMUSCULAR | Status: DC | PRN
  Administered 2020-05-17: 4 mg via INTRAVENOUS
  Filled 2020-05-16 (×2): qty 2

## 2020-05-16 MED ORDER — ACETAMINOPHEN 325 MG PO TABS: 650.0000 mg | ORAL_TABLET | Freq: Four times a day (QID) | ORAL | Status: DC | PRN

## 2020-05-16 MED ORDER — GLUCOSAMINE-CHONDROITIN 500-400 MG PO TABS: 1.0000 | ORAL_TABLET | Freq: Two times a day (BID) | ORAL | Status: DC

## 2020-05-16 MED ORDER — CALCIUM CITRATE-VITAMIN D 500-500 MG-UNIT PO CHEW
1.0000 | CHEWABLE_TABLET | Freq: Two times a day (BID) | ORAL | Status: DC
  Administered 2020-05-17 – 2020-05-18 (×2): 1 via ORAL
  Filled 2020-05-16 (×5): qty 1

## 2020-05-16 MED ORDER — SODIUM CHLORIDE 0.9 % IV SOLN: INTRAVENOUS | Status: DC

## 2020-05-16 MED ORDER — OMEGA-3-ACID ETHYL ESTERS 1 G PO CAPS
1000.0000 mg | ORAL_CAPSULE | Freq: Every day | ORAL | Status: DC
  Administered 2020-05-18: 1000 mg via ORAL
  Filled 2020-05-16: qty 1

## 2020-05-16 MED ORDER — IOHEXOL 350 MG/ML SOLN
100.0000 mL | Freq: Once | INTRAVENOUS | Status: AC | PRN
  Administered 2020-05-16: 100 mL via INTRAVENOUS

## 2020-05-16 MED ORDER — ASCORBIC ACID 500 MG PO TABS
500.0000 mg | ORAL_TABLET | Freq: Every day | ORAL | Status: DC
  Administered 2020-05-18: 500 mg via ORAL
  Filled 2020-05-16: qty 1

## 2020-05-16 MED ORDER — TRAZODONE HCL 50 MG PO TABS: 25.0000 mg | ORAL_TABLET | Freq: Every evening | ORAL | Status: DC | PRN

## 2020-05-16 MED ORDER — ONDANSETRON HCL 4 MG PO TABS
4.0000 mg | ORAL_TABLET | Freq: Four times a day (QID) | ORAL | Status: DC | PRN
  Administered 2020-05-17: 4 mg via ORAL
  Filled 2020-05-16: qty 1

## 2020-05-16 MED ORDER — ACETAMINOPHEN 650 MG RE SUPP: 650.0000 mg | Freq: Four times a day (QID) | RECTAL | Status: DC | PRN

## 2020-05-16 MED ORDER — HYDROCOD POLST-CPM POLST ER 10-8 MG/5ML PO SUER
5.0000 mL | Freq: Two times a day (BID) | ORAL | Status: DC
  Administered 2020-05-16 – 2020-05-18 (×4): 5 mL via ORAL
  Filled 2020-05-16 (×4): qty 5

## 2020-05-16 MED ORDER — PANTOPRAZOLE SODIUM 40 MG IV SOLR
40.0000 mg | Freq: Two times a day (BID) | INTRAVENOUS | Status: DC
  Administered 2020-05-16 – 2020-05-18 (×4): 40 mg via INTRAVENOUS
  Filled 2020-05-16 (×4): qty 40

## 2020-05-16 MED ORDER — ADULT MULTIVITAMIN W/MINERALS CH
1.0000 | ORAL_TABLET | Freq: Every day | ORAL | Status: DC
  Administered 2020-05-18: 1 via ORAL
  Filled 2020-05-16: qty 1

## 2020-05-16 MED ORDER — ALBUTEROL SULFATE (2.5 MG/3ML) 0.083% IN NEBU: 2.5000 mg | INHALATION_SOLUTION | Freq: Four times a day (QID) | RESPIRATORY_TRACT | Status: DC | PRN

## 2020-05-16 MED ORDER — LOSARTAN POTASSIUM 50 MG PO TABS
50.0000 mg | ORAL_TABLET | Freq: Every day | ORAL | Status: DC
  Administered 2020-05-17 – 2020-05-18 (×2): 50 mg via ORAL
  Filled 2020-05-16 (×2): qty 1

## 2020-05-16 NOTE — ED Triage Notes (Signed)
Pt c/o of rectal bleeding. Pt states blood is bright red with clots. Pt has hx of cancer and chemo and radiation.

## 2020-05-16 NOTE — ED Provider Notes (Signed)
[facility] Emergency Department Provider Note  ____________________________________________  Time seen: Approximately 7:41 PM  I have reviewed the triage vital signs and the nursing notes.   HISTORY  Chief Complaint Rectal Bleeding    HPI Trevor Carlson is a 85 y.o. male who presents the emergency department complaining of bright red rectal bleeding. Patient states that this afternoon he went to the bathroom, saw a large amount of bright red blood. He has had no abdominal pain. No chronic GI complaints. Patient states that he has had hemorrhoids in the past but he has never had bleeding like this and typically has significant pain. No pain associated with this blood. He denies any nausea, vomiting, diarrhea or constipation. Again no chronic GI complaints such as Crohn's, colitis, diverticulosis or diverticulitis. Patient states that he takes a daily aspirin but does not take a blood thinner. Patient does have a history of hiatal hernia, hypertension, prostate cancer, arthritis.         Past Medical History:  Diagnosis Date  . Actinic keratosis   . Arthritis    osteoarthritis right knee  . Basal cell carcinoma 06/2010   L nasal dorsum/med canthus, txted by Dr. Lacinda Axon, Mohs  . History of hiatal hernia   . Hypertension   . Pre-syncope   . Prostate cancer ([place])   . Squamous cell carcinoma of skin 11/2012   SCC IS L ear concha  . Urinary incontinence     There are no problems to display for this patient.   Past Surgical History:  Procedure Laterality Date  . COLONOSCOPY  2015  . dental implants    . ESOPHAGOGASTRODUODENOSCOPY (EGD) WITH PROPOFOL N/A 12/06/2016   Procedure: ESOPHAGOGASTRODUODENOSCOPY (EGD) WITH PROPOFOL;  Surgeon: Lollie Sails, MD;  [facility] ENDOSCOPY;  Service: Endoscopy;  Laterality: N/A;  . ESOPHAGOGASTRODUODENOSCOPY (EGD) WITH PROPOFOL N/A 01/10/2017   Procedure: ESOPHAGOGASTRODUODENOSCOPY (EGD) WITH PROPOFOL;  Surgeon:  Lollie Sails, MD;  Location: [facility] ENDOSCOPY;  Service: Endoscopy;  Laterality: N/A;  . PROSTATE SURGERY      Prior to Admission medications   Medication Sig Start Date End Date Taking? Authorizing Provider  albuterol (PROVENTIL HFA;VENTOLIN HFA) 108 (90 Base) MCG/ACT inhaler Inhale 1-2 puffs into the lungs every 6 (six) hours as needed for wheezing or shortness of breath.    [provider]  aspirin EC 81 MG tablet Take 81 mg by mouth daily.    [provider]  calcium citrate-vitamin D (CITRACAL+D) 315-200 MG-UNIT tablet Take 1 tablet by mouth 2 (two) times daily.    [provider]  chlorpheniramine-HYDROcodone (TUSSIONEX PENNKINETIC ER) 10-8 MG/5ML SUER Take 5 mLs by mouth 2 (two) times daily. 09/15/16   Earleen Newport, MD  Coenzyme Q10 (CO Q-10) 100 MG CAPS Take 1 capsule by mouth daily.    [provider]  glucosamine-chondroitin 500-400 MG tablet Take 1 tablet by mouth 2 (two) times daily.    [provider]  losartan (COZAAR) 50 MG tablet Take 50 mg by mouth daily.    [provider]  Multiple Vitamin (MULTIVITAMIN) tablet Take 1 tablet by mouth daily.    [provider]  naproxen sodium (ANAPROX) 220 MG tablet Take 220 mg by mouth 2 (two) times daily with a meal.    [provider]  OMEGA-3 FATTY ACIDS PO Take 1,000 mg by mouth daily.    [provider]  omeprazole (PRILOSEC) 40 MG capsule Take 40 mg by mouth daily.    [provider]  pantoprazole (PROTONIX) 40 MG tablet Take 40 mg by mouth daily.    [provider]  Plant Sterols and Stanols (CHOLESTOFF PLUS PO) Take 2 tablets by mouth 2 (two) times daily.    [provider]  sucralfate (CARAFATE) 1 g tablet Take 1 g by mouth 4 (four) times daily -  with meals and at bedtime.    [provider]  vitamin C (ASCORBIC ACID) 500 MG tablet Take 500 mg by mouth daily.    [provider]     Allergies Patient has no known allergies.  No family history on file.  Social History Social History   Tobacco Use  . Smoking status: Former Smoker    Packs/day: 1.00    Years: 35.00    Pack years: 35.00    Types: Cigarettes    Quit date: 03/26/1974    Years since quitting: 46.1  . Smokeless tobacco: Former Systems developer    Quit date: 03/27/1979  Substance Use Topics  . Alcohol use: No  . Drug use: Never     Review of Systems  Constitutional: No fever/chills Eyes: No visual changes. No discharge ENT: No upper respiratory complaints. Cardiovascular: no chest pain. Respiratory: no cough. No SOB. Gastrointestinal: Bright red rectal bleeding. No abdominal pain.  No nausea, no vomiting.  No diarrhea.  No constipation. Genitourinary: Negative for dysuria. No hematuria Musculoskeletal: Negative for musculoskeletal pain. Skin: Negative for rash, abrasions, lacerations, ecchymosis. Neurological: Negative for headaches, focal weakness or numbness.  10 System ROS otherwise negative.  ____________________________________________   PHYSICAL EXAM:  VITAL SIGNS: ED Triage Vitals  Enc Vitals Group     BP 05/16/20 1905 (!) 159/111     Pulse Rate 05/16/20 1905 79     Resp 05/16/20 1905 18     Temp 05/16/20 1905 97.8 F (36.6 C)     Temp Source 05/16/20 1905 Oral     SpO2 05/16/20 1905 99 %     Weight 05/16/20 1904 195 lb (88.5 kg)     Height 05/16/20 1904 6' (1.829 m)     Head Circumference --      Peak Flow --      Pain Score --      Pain Loc --      Pain Edu? --      Excl. in [place]? --      Constitutional: Alert and oriented. Well appearing and in no acute distress. Eyes: Conjunctivae are normal. PERRL. EOMI. Head: Atraumatic. ENT:      Ears:       Nose: No congestion/rhinnorhea.      Mouth/Throat: Mucous membranes are moist.  Neck: No stridor.    Cardiovascular: Normal rate, regular rhythm. Normal S1 and S2.  Good peripheral circulation. Respiratory: Normal  respiratory effort without tachypnea or retractions. Lungs CTAB. Good air entry to the bases with no decreased or absent breath sounds. Gastrointestinal: Bowel sounds 4 quadrants. No visible external abdominal findings. Soft and nontender to palpation in all quadrants.. No guarding or rigidity. No palpable masses. No distention. No CVA tenderness. Musculoskeletal: Full range of motion to all extremities. No gross deformities appreciated. Neurologic:  Normal speech and language. No gross focal neurologic deficits are appreciated.  Skin:  Skin is warm, dry and intact. No rash noted. Psychiatric: Mood and affect are normal. Speech and behavior are normal. Patient exhibits appropriate insight and judgement.   ____________________________________________   LABS (all labs ordered are listed, but only abnormal results are displayed)  Labs  Reviewed  COMPREHENSIVE METABOLIC PANEL - Abnormal; Notable for the following components:      Result Value   Calcium 8.6 (*)    All other components within normal limits  CBC  PROTIME-INR  APTT  POC OCCULT BLOOD, ED  TYPE AND SCREEN   ____________________________________________  EKG   ____________________________________________  RADIOLOGY I personally viewed and evaluated these images as part of my medical decision making, as well as reviewing the written report by the radiologist.  ED Provider Interpretation: Proctocolitis, constipation with out acute source of bleeding identified on CT scan.  CT Angio Abd/Pel w/ and/or w/o  Result Date: 05/16/2020 CLINICAL DATA:  Melena rectal bleeding EXAM: CTA ABDOMEN AND PELVIS WITHOUT AND WITH CONTRAST TECHNIQUE: Multidetector CT imaging of the abdomen and pelvis was performed using the standard protocol during bolus administration of intravenous contrast. Multiplanar reconstructed images and MIPs were obtained and reviewed to evaluate the vascular anatomy. CONTRAST:  199mL OMNIPAQUE IOHEXOL 350 MG/ML SOLN  COMPARISON:  None. FINDINGS: CTA ABDOMEN AND PELVIS FINDINGS VASCULAR Aorta: Normal caliber aorta without aneurysm, dissection, vasculitis or hemodynamically significant stenosis. There is moderate to advanced and noncalcified aortic atherosclerosis. Celiac: There is mild stenosis seen at the origin of the celiac due to atherosclerosis. SMA: There is mild stenosis at the origin of the SMA due to atherosclerosis. Renals: Single renal arteries bilaterally. No aneurysm, dissection, stenosis or evidence of fibromuscular dysplasia. Mild atherosclerosis in the renal arteries. IMA: Patent without abnormality. Inflow: Moderate atherosclerosis seen in the iliacs. Veins: Normal course and caliber of the major veins. Assessment is otherwise limited by the arterial dominant contrast phase. Review of the MIP images confirms the above findings. NON-VASCULAR Hepatobiliary: Multiple low-density lesions are seen throughout the liver parenchyma. The largest within the anterior right liver lobe measuring cm. No visible biliary dilatation. Normal gallbladder. Pancreas: Normal contours without ductal dilatation. No peripancreatic fluid collection. Spleen: Normal arterial phase splenic enhancement pattern. Adrenals/Urinary Tract: --Adrenal glands: Normal. Kidneys: Multiple bilateral low-density lesions are seen throughout both kidneys with probable left-sided parapelvic cysts. No renal or collecting system calculi. No hydronephrosis.--Urinary bladder: Unremarkable. Stomach/Bowel: --Stomach/Duodenum: No hiatal hernia or other gastric abnormality. Normal duodenal course and caliber. --Small bowel: No dilatation or inflammation. --Colon: There is a large amount of stool seen throughout with diverticulosis. There appears to be wall thickening seen around the rectum with perirectal edema. Lymphatic:  No abdominal or pelvic lymphadenopathy. Reproductive: No free fluid in the pelvis. Musculoskeletal. No bony spinal canal stenosis or focal  osseous abnormality. A chronic compression deformity of the T12 vertebral body. Other: None. Review of the MIP images confirms the above findings. IMPRESSION: VASCULAR Moderate to advanced aortic atherosclerosis. No acute aortic abnormality. Mild stenosis at the origin of the SMA and celiac axis due to atherosclerosis. NON-VASCULAR Findings which could be suggestive of mild proctocolitis. Moderate to large amount of colonic stool present throughout. Multiple hepatic and renal cysts. Electronically Signed   By: Prudencio Pair M.D.   On: 05/16/2020 21:00    ____________________________________________    PROCEDURES  Procedure(s) performed:    Procedures    Medications  iohexol (OMNIPAQUE) 350 MG/ML injection 100 mL (100 mLs Intravenous Contrast Given 05/16/20 2022)     ____________________________________________   INITIAL IMPRESSION / ASSESSMENT AND PLAN / ED COURSE  Pertinent labs & imaging results that were available during my care of the patient were reviewed by me and considered in my medical decision making (see chart for details).  Review of the [place] CSRS was  performed in accordance of the [place] prior to dispensing any controlled drugs.           Patient's diagnosis is consistent with Proctocolitis, GI bleeding, constipation.  Patient presented to emergency department complaining of rectal bleeding starting today.  Patient states that he is having bright red blood per rectum.  He does have a history of hemorrhoids but has never had this amount of blood before.  He denied any rectal pain or abdominal pain.  No chronic GI complaints.  Patient did have a bowel movement with frank blood identified in the emergency department.  This was bright red.  I believe the patient's source is likely lower GI bleeding.  Patient had labs with reassuring H&H currently.  Patient with reassuring abdominal exam with no tenderness or other acute findings on exam.  Imaging revealed proctocolitis,  constipation but no other acute source of GI bleeding.  This time patient's vitals are stable, labs are stable as well as presentation stable.  Discussed the results with the patient.  Patient would prefer to be admitted to be observed overnight to ensure stable labs.  I feel this is reasonable and hospital service will be contacted for admission for GI bleeding.  Hospitalist service agrees to admit the patient and patient care will be transferred to the hospitalist team at this time.     ____________________________________________  FINAL CLINICAL IMPRESSION(S) / ED DIAGNOSES  Final diagnoses:  Lower GI bleed  Slow transit constipation      NEW MEDICATIONS STARTED DURING THIS VISIT:  ED Discharge Orders    None          This chart was dictated using voice recognition software/Dragon. Despite best efforts to proofread, errors can occur which can change the meaning. Any change was purely unintentional.    Darletta Moll, PA-C 05/16/20 2211    Nance Pear, MD 05/17/20 1505

## 2020-05-16 NOTE — Progress Notes (Signed)
PHARMACIST - PHYSICIAN ORDER COMMUNICATION  CONCERNING: P&T Medication Policy on Herbal Medications  DESCRIPTION:  This patient's order for:  Glucosamine-chondroitin capsules,  Plant sterols and stanols  has been noted.  This product(s) is classified as an "herbal" or natural product. Due to a lack of definitive safety studies or FDA approval, nonstandard manufacturing practices, plus the potential risk of unknown drug-drug interactions while on inpatient medications, the Pharmacy and Therapeutics Committee does not permit the use of "herbal" or natural products of this type within [facility].   ACTION TAKEN: The pharmacy department is unable to verify this order at this time and your patient has been informed of this safety policy. Please reevaluate patient's clinical condition at discharge and address if the herbal or natural product(s) should be resumed at that time.

## 2020-05-16 NOTE — H&P (Signed)
[facility]   PATIENT NAME: Trevor Carlson    MR#:  [id number]  DATE OF BIRTH:  08/18/1934  DATE OF ADMISSION:  05/16/2020  PRIMARY CARE PHYSICIAN: Tracie Harrier, MD   REQUESTING/REFERRING PHYSICIAN: Cuthriell, Charline Bills, PA-C  CHIEF COMPLAINT:   Chief Complaint  Patient presents with  . Rectal Bleeding    HISTORY OF PRESENT ILLNESS:  Trevor Carlson  is a 85 y.o. Caucasian male with a known history of hypertension, GERD and prostate cancer, who presented to the emergency room with acute onset of bright red bleeding per rectum that occurred around 6 PM recurred 2-3 times.  He denied any melanotic stools.  He denied any abdominal pain or nausea or vomiting.  No fever or chills.  No cough or wheezing or dyspnea.  No other bleeding diathesis.  No dysuria, oliguria or hematuria or flank pain.  He denies any chest pain or palpitations.  Upon presentation to the emergency room, blood pressure was 159/111 with otherwise normal vital signs.  Labs revealed unremarkable CMP.  CBC showed hemoglobin of 15 hematocrit 44.3.  PT was 12.7 INR 1.  Influenza antigens and COVID-19 PCR came back negative.  CT angio of the abdomen and pelvis showed moderate to advanced aortic atherosclerosis, mild proctocolitis, moderate to large amount of colonic stool present throughout and multiple hepatic and renal cysts.  The patient will be admitted to a medical monitored bed for further evaluation and management.  PAST MEDICAL HISTORY:   Past Medical History:  Diagnosis Date  . Actinic keratosis   . Arthritis    osteoarthritis right knee  . Basal cell carcinoma 06/2010   L nasal dorsum/med canthus, txted by Dr. Lacinda Axon, Mohs  . History of hiatal hernia   . Hypertension   . Pre-syncope   . Prostate cancer ([place])   . Squamous cell carcinoma of skin 11/2012   SCC IS L ear concha  . Urinary incontinence     PAST SURGICAL HISTORY:   Past Surgical History:  Procedure Laterality Date  . COLONOSCOPY  2015   . dental implants    . ESOPHAGOGASTRODUODENOSCOPY (EGD) WITH PROPOFOL N/A 12/06/2016   Procedure: ESOPHAGOGASTRODUODENOSCOPY (EGD) WITH PROPOFOL;  Surgeon: Lollie Sails, MD;  Location: [facility] ENDOSCOPY;  Service: Endoscopy;  Laterality: N/A;  . ESOPHAGOGASTRODUODENOSCOPY (EGD) WITH PROPOFOL N/A 01/10/2017   Procedure: ESOPHAGOGASTRODUODENOSCOPY (EGD) WITH PROPOFOL;  Surgeon: Lollie Sails, MD;  Location: [facility] ENDOSCOPY;  Service: Endoscopy;  Laterality: N/A;  . PROSTATE SURGERY      SOCIAL HISTORY:   Social History   Tobacco Use  . Smoking status: Former Smoker    Packs/day: 1.00    Years: 35.00    Pack years: 35.00    Types: Cigarettes    Quit date: 03/26/1974    Years since quitting: 46.1  . Smokeless tobacco: Former Systems developer    Quit date: 03/27/1979  Substance Use Topics  . Alcohol use: No    FAMILY HISTORY:  No family history on file.  DRUG ALLERGIES:  No Known Allergies  REVIEW OF SYSTEMS:   ROS As per history of present illness. All pertinent systems were reviewed above. Constitutional, HEENT, cardiovascular, respiratory, GI, GU, musculoskeletal, neuro, psychiatric, endocrine, integumentary and hematologic systems were reviewed and are otherwise negative/unremarkable except for positive findings mentioned above in the HPI.   MEDICATIONS AT HOME:   Prior to Admission medications   Medication Sig Start Date End Date Taking? Authorizing Provider  albuterol (PROVENTIL HFA;VENTOLIN HFA) 108 (90 Base)  MCG/ACT inhaler Inhale 1-2 puffs into the lungs every 6 (six) hours as needed for wheezing or shortness of breath.   Yes [provider]  aspirin EC 81 MG tablet Take 81 mg by mouth daily.   Yes [provider]  calcium citrate-vitamin D (CITRACAL+D) 315-200 MG-UNIT tablet Take 1 tablet by mouth 2 (two) times daily.   Yes [provider]  Coenzyme Q10 (CO Q-10) 100 MG CAPS Take 1 capsule by mouth daily.   Yes [provider]   glucosamine-chondroitin 500-400 MG tablet Take 1 tablet by mouth 2 (two) times daily.   Yes [provider]  losartan (COZAAR) 50 MG tablet Take 50 mg by mouth daily.   Yes [provider]  Multiple Vitamin (MULTIVITAMIN) tablet Take 1 tablet by mouth daily.   Yes [provider]  naproxen sodium (ANAPROX) 220 MG tablet Take 220 mg by mouth 2 (two) times daily with a meal.   Yes [provider]  OMEGA-3 FATTY ACIDS PO Take 1,000 mg by mouth daily.   Yes [provider]  omeprazole (PRILOSEC) 40 MG capsule Take 40 mg by mouth daily.   Yes [provider]  Plant Sterols and Stanols (CHOLESTOFF PLUS PO) Take 2 tablets by mouth 2 (two) times daily.   Yes [provider]  vitamin C (ASCORBIC ACID) 500 MG tablet Take 500 mg by mouth daily.   Yes [provider]      VITAL SIGNS:  Blood pressure (!) 168/86, pulse 70, temperature 97.8 F (36.6 C), temperature source Oral, resp. rate 18, height 6' (1.829 m), weight 88.5 kg, SpO2 100 %.  PHYSICAL EXAMINATION:  Physical Exam  GENERAL:  85 y.o.-year-old Caucasian male patient lying in the bed with no acute distress.  EYES: Pupils equal, round, reactive to light and accommodation. No scleral icterus. Extraocular muscles intact.  HEENT: Head atraumatic, normocephalic. Oropharynx and nasopharynx clear.  NECK:  Supple, no jugular venous distention. No thyroid enlargement, no tenderness.  LUNGS: Normal breath sounds bilaterally, no wheezing, rales,rhonchi or crepitation. No use of accessory muscles of respiration.  CARDIOVASCULAR: Regular rate and rhythm, S1, S2 normal. No murmurs, rubs, or gallops.  ABDOMEN: Soft, nondistended, nontender. Bowel sounds present. No organomegaly or mass.  EXTREMITIES: No pedal edema, cyanosis, or clubbing.  NEUROLOGIC: Cranial nerves II through XII are intact. Muscle strength 5/5 in all extremities. Sensation intact. Gait not checked.  PSYCHIATRIC: The  patient is alert and oriented x 3.  Normal affect and good eye contact. SKIN: No obvious rash, lesion, or ulcer.   LABORATORY PANEL:   CBC Recent Labs  Lab 05/16/20 1910  WBC 6.7  HGB 15.0  HCT 44.3  PLT 230   ------------------------------------------------------------------------------------------------------------------  Chemistries  Recent Labs  Lab 05/16/20 1910  NA 138  K 4.4  CL 105  CO2 24  GLUCOSE 84  BUN 16  CREATININE 0.93  CALCIUM 8.6*  AST 21  ALT 14  ALKPHOS 62  BILITOT 0.8   ------------------------------------------------------------------------------------------------------------------  Cardiac Enzymes No results for input(s): TROPONINI in the last 168 hours. ------------------------------------------------------------------------------------------------------------------  RADIOLOGY:  CT Angio Abd/Pel w/ and/or w/o  Result Date: 05/16/2020 CLINICAL DATA:  Melena rectal bleeding EXAM: CTA ABDOMEN AND PELVIS WITHOUT AND WITH CONTRAST TECHNIQUE: Multidetector CT imaging of the abdomen and pelvis was performed using the standard protocol during bolus administration of intravenous contrast. Multiplanar reconstructed images and MIPs were obtained and reviewed to evaluate the vascular anatomy. CONTRAST:  167mL OMNIPAQUE IOHEXOL 350 MG/ML SOLN COMPARISON:  None. FINDINGS: CTA ABDOMEN AND PELVIS FINDINGS VASCULAR Aorta: Normal caliber aorta without aneurysm, dissection, vasculitis or hemodynamically significant stenosis. There is moderate to advanced and noncalcified aortic atherosclerosis. Celiac: There is mild stenosis seen at the origin of the celiac due to atherosclerosis. SMA: There is mild stenosis at the origin of the SMA due to atherosclerosis. Renals: Single renal arteries bilaterally. No aneurysm, dissection, stenosis or evidence of fibromuscular dysplasia. Mild atherosclerosis in the renal arteries. IMA: Patent without abnormality. Inflow: Moderate  atherosclerosis seen in the iliacs. Veins: Normal course and caliber of the major veins. Assessment is otherwise limited by the arterial dominant contrast phase. Review of the MIP images confirms the above findings. NON-VASCULAR Hepatobiliary: Multiple low-density lesions are seen throughout the liver parenchyma. The largest within the anterior right liver lobe measuring cm. No visible biliary dilatation. Normal gallbladder. Pancreas: Normal contours without ductal dilatation. No peripancreatic fluid collection. Spleen: Normal arterial phase splenic enhancement pattern. Adrenals/Urinary Tract: --Adrenal glands: Normal. Kidneys: Multiple bilateral low-density lesions are seen throughout both kidneys with probable left-sided parapelvic cysts. No renal or collecting system calculi. No hydronephrosis.--Urinary bladder: Unremarkable. Stomach/Bowel: --Stomach/Duodenum: No hiatal hernia or other gastric abnormality. Normal duodenal course and caliber. --Small bowel: No dilatation or inflammation. --Colon: There is a large amount of stool seen throughout with diverticulosis. There appears to be wall thickening seen around the rectum with perirectal edema. Lymphatic:  No abdominal or pelvic lymphadenopathy. Reproductive: No free fluid in the pelvis. Musculoskeletal. No bony spinal canal stenosis or focal osseous abnormality. A chronic compression deformity of the T12 vertebral body. Other: None. Review of the MIP images confirms the above findings. IMPRESSION: VASCULAR Moderate to advanced aortic atherosclerosis. No acute aortic abnormality. Mild stenosis at the origin of the SMA and celiac axis due to atherosclerosis. NON-VASCULAR Findings which could be suggestive of mild proctocolitis. Moderate to large amount of colonic stool present throughout. Multiple hepatic and renal cysts. Electronically Signed   By: Prudencio Pair M.D.   On: 05/16/2020 21:00      IMPRESSION AND PLAN:   1.  GI bleeding with bright red bleeding  per rectum likely of lower GI etiology. His abdominal CT scan findings of mild proctocolitis that could be contributing to his bleeding. -The patient will be admitted to a medical monitored bed. -We will follow serial hemoglobins and hematocrits. -He will be hydrated with IV normal saline. -We will place him on IV Rocephin and Flagyl. -GI consultation will be obtained. -I notified Dr. Alice Reichert about the patient.  2.  Hypertensive urgency. -The patient will be placed on as needed IV labetalol and will continue his Cozaar.  3.  GERD. -He will be placed on IV PPI therapy I will continue his Carafate.  4.  DVT prophylaxis. -SCDs. -Medical prophylaxis currently contraindicated due to GI bleeding   All the records are reviewed and case discussed with ED provider. The plan of care was discussed in details with the patient (and family). I answered all questions. The patient agreed to proceed with the above mentioned plan. Further management will depend upon hospital course.   CODE STATUS: Full code  Status is: Inpatient  Remains inpatient appropriate because:Ongoing diagnostic testing needed not appropriate for outpatient work up, Unsafe d/c plan, IV treatments appropriate due to intensity of illness or inability to take PO and Inpatient level of care appropriate due to severity of illness   Dispo: The patient is from: Home  Anticipated d/c is to: Home              Anticipated d/c date is: 2 days              Patient currently is not medically stable to d/c.    TOTAL TIME TAKING CARE OF THIS PATIENT: 55 minutes.    Christel Mormon M.D on 05/16/2020 at 11:20 PM  [facility]   From 7 PM-7 AM, contact night-coverage [email or web address]  CC: Primary care physician; Tracie Harrier, MD

## 2020-05-16 NOTE — Discharge Instructions (Addendum)

## 2020-05-17 ENCOUNTER — Inpatient Hospital Stay: Payer: Medicare Other

## 2020-05-17 ENCOUNTER — Encounter: Payer: Self-pay | Admitting: Family Medicine

## 2020-05-17 DIAGNOSIS — K920 Hematemesis: Secondary | ICD-10-CM | POA: Diagnosis not present

## 2020-05-17 DIAGNOSIS — R112 Nausea with vomiting, unspecified: Secondary | ICD-10-CM | POA: Diagnosis not present

## 2020-05-17 DIAGNOSIS — I10 Essential (primary) hypertension: Secondary | ICD-10-CM | POA: Diagnosis not present

## 2020-05-17 LAB — BASIC METABOLIC PANEL
Anion gap: 5 (ref 5–15)
BUN: 13 mg/dL (ref 8–23)
CO2: 24 mmol/L (ref 22–32)
Calcium: 8.2 mg/dL — ABNORMAL LOW (ref 8.9–10.3)
Chloride: 109 mmol/L (ref 98–111)
Creatinine, Ser: 0.87 mg/dL (ref 0.61–1.24)
GFR, Estimated: >60 mL/min (ref >60)
Glucose, Bld: 90 mg/dL (ref 70–99)
Potassium: 3.9 mmol/L (ref 3.5–5.1)
Sodium: 138 mmol/L (ref 135–145)

## 2020-05-17 LAB — CBC
HCT: 41.1 % (ref 39.0–52.0)
Hemoglobin: 13.9 g/dL (ref 13.0–17.0)
MCH: 30.6 pg (ref 26.0–34.0)
MCHC: 33.8 g/dL (ref 30.0–36.0)
MCV: 90.5 fL (ref 80.0–100.0)
Platelets: 186 10*3/uL (ref 150–400)
RBC: 4.54 MIL/uL (ref 4.22–5.81)
RDW: 14.2 % (ref 11.5–15.5)
WBC: 5.3 10*3/uL (ref 4.0–10.5)
nRBC: 0 % (ref 0.0–0.2)

## 2020-05-17 LAB — HEMOGLOBIN AND HEMATOCRIT, BLOOD
HCT: 38.8 % — ABNORMAL LOW (ref 39.0–52.0)
HCT: 41 % (ref 39.0–52.0)
HCT: 41.1 % (ref 39.0–52.0)
Hemoglobin: 13.1 g/dL (ref 13.0–17.0)
Hemoglobin: 13.8 g/dL (ref 13.0–17.0)
Hemoglobin: 13.8 g/dL (ref 13.0–17.0)

## 2020-05-17 MED ORDER — INFLUENZA VAC A&B SA ADJ QUAD 0.5 ML IM PRSY
0.5000 mL | PREFILLED_SYRINGE | INTRAMUSCULAR | Status: AC
  Administered 2020-05-18: 0.5 mL via INTRAMUSCULAR
  Filled 2020-05-17 (×2): qty 0.5

## 2020-05-17 MED ORDER — LABETALOL HCL 5 MG/ML IV SOLN: 20.0000 mg | INTRAVENOUS | Status: DC | PRN

## 2020-05-17 MED ORDER — SENNOSIDES-DOCUSATE SODIUM 8.6-50 MG PO TABS
2.0000 | ORAL_TABLET | Freq: Two times a day (BID) | ORAL | Status: DC
  Administered 2020-05-17 – 2020-05-18 (×3): 2 via ORAL
  Filled 2020-05-17 (×3): qty 2

## 2020-05-17 MED ORDER — ALUM & MAG HYDROXIDE-SIMETH 200-200-20 MG/5ML PO SUSP
30.0000 mL | Freq: Four times a day (QID) | ORAL | Status: DC | PRN
  Filled 2020-05-17: qty 30

## 2020-05-17 MED ORDER — BISACODYL 10 MG RE SUPP: 10.0000 mg | Freq: Every day | RECTAL | Status: DC | PRN

## 2020-05-17 MED ORDER — PROMETHAZINE HCL 25 MG/ML IJ SOLN
12.5000 mg | Freq: Four times a day (QID) | INTRAMUSCULAR | Status: DC | PRN
  Filled 2020-05-17: qty 1

## 2020-05-17 MED ORDER — INFLUENZA VAC A&B SA ADJ QUAD 0.5 ML IM PRSY: 0.5000 mL | PREFILLED_SYRINGE | INTRAMUSCULAR | Status: DC

## 2020-05-17 MED ORDER — POLYETHYLENE GLYCOL 3350 17 G PO PACK
17.0000 g | PACK | Freq: Every day | ORAL | Status: DC
  Administered 2020-05-18: 17 g via ORAL
  Filled 2020-05-17: qty 1

## 2020-05-17 NOTE — Progress Notes (Addendum)
1        [place] [facility] [place] NAME: Trevor Carlson    MR#:  [id number]  DATE OF BIRTH:  02/12/1935  SUBJECTIVE:  CHIEF COMPLAINT:   Chief Complaint  Patient presents with  . Rectal Bleeding  No further rectal bleeding while here in the hospital.  Had 2 episodes of vomiting when trying to eat food.  daughter at bedside REVIEW OF SYSTEMS:  Review of Systems  Constitutional: Negative for diaphoresis, fever, malaise/fatigue and weight loss.  HENT: Negative for ear discharge, ear pain, hearing loss, nosebleeds, sore throat and tinnitus.   Eyes: Negative for blurred vision and pain.  Respiratory: Negative for cough, hemoptysis, shortness of breath and wheezing.   Cardiovascular: Negative for chest pain, palpitations, orthopnea and leg swelling.  Gastrointestinal: Positive for blood in stool and vomiting. Negative for abdominal pain, constipation, diarrhea, heartburn and nausea.  Genitourinary: Negative for dysuria, frequency and urgency.  Musculoskeletal: Negative for back pain and myalgias.  Skin: Negative for itching and rash.  Neurological: Negative for dizziness, tingling, tremors, focal weakness, seizures, weakness and headaches.  Psychiatric/Behavioral: Negative for depression. The patient is not nervous/anxious.    DRUG ALLERGIES:  No Known Allergies VITALS:  Blood pressure (!) 163/92, pulse 65, temperature 98.5 F (36.9 C), resp. rate 20, height 6' (1.829 m), weight 88.5 kg, SpO2 97 %. PHYSICAL EXAMINATION:  Physical Exam HENT:     Head: Normocephalic and atraumatic.  Eyes:     Conjunctiva/sclera: Conjunctivae normal.     Pupils: Pupils are equal, round, and reactive to light.  Neck:     Thyroid: No thyromegaly.     Trachea: No tracheal deviation.  Cardiovascular:     Rate and Rhythm: Normal rate and regular rhythm.     Heart sounds: Normal heart sounds.  Pulmonary:     Effort: Pulmonary effort is normal. No respiratory distress.     Breath  sounds: Normal breath sounds. No wheezing.  Chest:     Chest wall: No tenderness.  Abdominal:     General: Bowel sounds are normal. There is no distension.     Palpations: Abdomen is soft.     Tenderness: There is no abdominal tenderness.  Musculoskeletal:        General: Normal range of motion.     Cervical back: Normal range of motion and neck supple.  Skin:    General: Skin is warm and dry.     Findings: No rash.  Neurological:     Mental Status: He is alert and oriented to person, place, and time.     Cranial Nerves: No cranial nerve deficit.    LABORATORY PANEL:  Male CBC Recent Labs  Lab 05/17/20 0449 05/17/20 0449 05/17/20 1011  WBC 5.3  --   --   HGB 13.9   < > 13.8  HCT 41.1   < > 41.0  PLT 186  --   --    < > = values in this interval not displayed.   ------------------------------------------------------------------------------------------------------------------ Chemistries  Recent Labs  Lab 05/16/20 1910 05/16/20 1910 05/17/20 0449  NA 138   < > 138  K 4.4   < > 3.9  CL 105   < > 109  CO2 24   < > 24  GLUCOSE 84   < > 90  BUN 16   < > 13  CREATININE 0.93   < > 0.87  CALCIUM 8.6*   < > 8.2*  AST 21  --   --  ALT 14  --   --   ALKPHOS 62  --   --   BILITOT 0.8  --   --    < > = values in this interval not displayed.   RADIOLOGY:  CT Angio Abd/Pel w/ and/or w/o  Result Date: 05/16/2020 CLINICAL DATA:  Melena rectal bleeding EXAM: CTA ABDOMEN AND PELVIS WITHOUT AND WITH CONTRAST TECHNIQUE: Multidetector CT imaging of the abdomen and pelvis was performed using the standard protocol during bolus administration of intravenous contrast. Multiplanar reconstructed images and MIPs were obtained and reviewed to evaluate the vascular anatomy. CONTRAST:  174mL OMNIPAQUE IOHEXOL 350 MG/ML SOLN COMPARISON:  None. FINDINGS: CTA ABDOMEN AND PELVIS FINDINGS VASCULAR Aorta: Normal caliber aorta without aneurysm, dissection, vasculitis or hemodynamically significant  stenosis. There is moderate to advanced and noncalcified aortic atherosclerosis. Celiac: There is mild stenosis seen at the origin of the celiac due to atherosclerosis. SMA: There is mild stenosis at the origin of the SMA due to atherosclerosis. Renals: Single renal arteries bilaterally. No aneurysm, dissection, stenosis or evidence of fibromuscular dysplasia. Mild atherosclerosis in the renal arteries. IMA: Patent without abnormality. Inflow: Moderate atherosclerosis seen in the iliacs. Veins: Normal course and caliber of the major veins. Assessment is otherwise limited by the arterial dominant contrast phase. Review of the MIP images confirms the above findings. NON-VASCULAR Hepatobiliary: Multiple low-density lesions are seen throughout the liver parenchyma. The largest within the anterior right liver lobe measuring cm. No visible biliary dilatation. Normal gallbladder. Pancreas: Normal contours without ductal dilatation. No peripancreatic fluid collection. Spleen: Normal arterial phase splenic enhancement pattern. Adrenals/Urinary Tract: --Adrenal glands: Normal. Kidneys: Multiple bilateral low-density lesions are seen throughout both kidneys with probable left-sided parapelvic cysts. No renal or collecting system calculi. No hydronephrosis.--Urinary bladder: Unremarkable. Stomach/Bowel: --Stomach/Duodenum: No hiatal hernia or other gastric abnormality. Normal duodenal course and caliber. --Small bowel: No dilatation or inflammation. --Colon: There is a large amount of stool seen throughout with diverticulosis. There appears to be wall thickening seen around the rectum with perirectal edema. Lymphatic:  No abdominal or pelvic lymphadenopathy. Reproductive: No free fluid in the pelvis. Musculoskeletal. No bony spinal canal stenosis or focal osseous abnormality. A chronic compression deformity of the T12 vertebral body. Other: None. Review of the MIP images confirms the above findings. IMPRESSION: VASCULAR  Moderate to advanced aortic atherosclerosis. No acute aortic abnormality. Mild stenosis at the origin of the SMA and celiac axis due to atherosclerosis. NON-VASCULAR Findings which could be suggestive of mild proctocolitis. Moderate to large amount of colonic stool present throughout. Multiple hepatic and renal cysts. Electronically Signed   By: Prudencio Pair M.D.   On: 05/16/2020 21:00   ASSESSMENT AND PLAN:  85 year old male with a known history of GERD, hypertension, prostate cancer status post chemo and radiation is admitted for bright red blood per rectum  *Bright red blood per rectum/painless hematochezia -Possible self-limited diverticular bleed and/or proctocolitis from chronic constipation -Hemodynamically stable -Continue Protonix 40 mg IV twice daily and sucralfate 1 g p.o. 3 times daily  Nausea and vomiting -Could be from constipation, obtain KUB and continue symptomatic management with Zofran and or Phenergan -Was not able to tolerate any diet, will make him n.p.o. except medications  Acute on chronic constipation -Start bowel regimen, this is likely due to long-term use of opiate  Hypertension Continue losartan 50 mg once daily and labetalol as needed  Body mass index is 26.45 kg/m.      Status is: Inpatient  Remains inpatient appropriate because:IV treatments  appropriate due to intensity of illness or inability to take PO   Dispo: The patient is from: Home              Anticipated d/c is to: Home              Anticipated d/c date is: 2 days              Patient currently is not medically stable to d/c.  Has persistent nausea and vomiting   DVT prophylaxis:       SCDs Start: 05/16/20 2217     Family Communication: Discussed with patient and updated daughter at bedside   All the records are reviewed and case discussed with Care Management/Social Worker. Management plans discussed with the patient, family/daughter at bedside and they are in agreement.  CODE  STATUS: Full Code  TOTAL TIME TAKING CARE OF THIS PATIENT: 35 minutes.   More than 50% of the time was spent in counseling/coordination of care: YES  POSSIBLE D/C IN 1-2 DAYS, DEPENDING ON CLINICAL CONDITION.   Max Sane M.D on 05/17/2020 at 2:38 PM  [facility]   CC: Primary care physician; Tracie Harrier, MD  Note: This dictation was prepared with Dragon dictation along with smaller phrase technology. Any transcriptional errors that result from this process are unintentional.

## 2020-05-17 NOTE — Consult Note (Signed)
GI Inpatient Consult Note  Reason for Consult: Hematochezia    Attending Requesting Consult: Dr. Eugenie Norrie, MD  History of Present Illness: Trevor Carlson is a 85 y.o. male seen for evaluation of hematochezia at the request of admitting hospitalist, Dr. Eugenie Norrie. Pt has a PMH of GERD, HTN, and Hx of prostate cancer s/p chemo and radiation. He presented to the ED yesterday evening after three episodes of painless hematochezia described as bright red blood mixed with clots. No associated fever, chills, abdominal pain or cramping, tenesmus, diarrhea, or fecal urgency. Upon presentation to the ED, he had stable hemoglobin at 15.0. CTA abd/pelvis showed mild proctocolitis, moderate to large amount of colonic stool, aortic atherosclerosis, and multiple hepatic and renal cysts. He was admitted for observation. He had one episode of hematochezia in the ED and reports one episode last night around 9 PM which was smaller volume than previously in the day. Patient seen and examined this morning resting comfortably in hospital bed. Daughter present in room. Hemoglobin this morning 13.9. He denies any fever, nausea, vomiting, abdominal pain, fecal urgency, tenesmus, diarrhea, or constipation. Patient denies any recent medication changes. He did have chemo and radiation for prostate cancer >20 years ago. Last colonoscopy 08/2013 performed by Dr. Candace Cruise showed inadequate colon prep with thick stool in descending and sigmoid colon, sigmoid diverticulosis, no polyps or masses seen. He reports he does take a daily aspirin. No other frequent NSAID use. No history of GI bleeding. He denies any constipation symptoms at baseline. He reports he normally has one formed BM every morning and denies any sensations of incomplete evacuation, straining, or small volume stools. Stool consistency normally like #4 on Bristol stool scale. He is requesting to eat something today as he is very hungry.     Last Colonoscopy:  08/2013 (Dr.  Candace Cruise) -Thick stool in descending and sigmoid colon -Sigmoid diverticulosis -No visualized polyps or masses  Last Endoscopy: 12/2016 (Dr. Gustavo Lah) -LA Grade D erosive esophagitis -Erosive gastritis -Normal duodenum -Tortuous esophagus    Past Medical History:  Past Medical History:  Diagnosis Date  . Actinic keratosis   . Arthritis    osteoarthritis right knee  . Basal cell carcinoma 06/2010   L nasal dorsum/med canthus, txted by Dr. Lacinda Axon, Mohs  . History of hiatal hernia   . Hypertension   . Pre-syncope   . Prostate cancer ([place])   . Squamous cell carcinoma of skin 11/2012   SCC IS L ear concha  . Urinary incontinence     Problem List: Patient Active Problem List   Diagnosis Date Noted  . GI bleeding 05/16/2020    Past Surgical History: Past Surgical History:  Procedure Laterality Date  . COLONOSCOPY  2015  . dental implants    . ESOPHAGOGASTRODUODENOSCOPY (EGD) WITH PROPOFOL N/A 12/06/2016   Procedure: ESOPHAGOGASTRODUODENOSCOPY (EGD) WITH PROPOFOL;  Surgeon: Lollie Sails, MD;  Location: [facility] ENDOSCOPY;  Service: Endoscopy;  Laterality: N/A;  . ESOPHAGOGASTRODUODENOSCOPY (EGD) WITH PROPOFOL N/A 01/10/2017   Procedure: ESOPHAGOGASTRODUODENOSCOPY (EGD) WITH PROPOFOL;  Surgeon: Lollie Sails, MD;  Location: [facility] ENDOSCOPY;  Service: Endoscopy;  Laterality: N/A;  . PROSTATE SURGERY      Allergies: No Known Allergies  Home Medications: Medications Prior to Admission  Medication Sig Dispense Refill Last Dose  . albuterol (PROVENTIL HFA;VENTOLIN HFA) 108 (90 Base) MCG/ACT inhaler Inhale 1-2 puffs into the lungs every 6 (six) hours as needed for wheezing or shortness of breath.   prn at prn  . aspirin EC  81 MG tablet Take 81 mg by mouth daily.   05/15/2020 at Unknown time  . calcium citrate-vitamin D (CITRACAL+D) 315-200 MG-UNIT tablet Take 1 tablet by mouth 2 (two) times daily.   05/16/2020 at Unknown time  . Coenzyme Q10 (CO Q-10) 100 MG CAPS Take 1 capsule by  mouth daily.   05/16/2020 at Unknown time  . glucosamine-chondroitin 500-400 MG tablet Take 1 tablet by mouth 2 (two) times daily.   05/16/2020 at Unknown time  . losartan (COZAAR) 50 MG tablet Take 50 mg by mouth daily.   05/16/2020 at Unknown time  . Multiple Vitamin (MULTIVITAMIN) tablet Take 1 tablet by mouth daily.   05/16/2020 at Unknown time  . naproxen sodium (ANAPROX) 220 MG tablet Take 220 mg by mouth 2 (two) times daily with a meal.   05/16/2020 at Unknown time  . OMEGA-3 FATTY ACIDS PO Take 1,000 mg by mouth daily.   05/16/2020 at Unknown time  . omeprazole (PRILOSEC) 40 MG capsule Take 40 mg by mouth daily.   05/16/2020 at Unknown time  . Plant Sterols and Stanols (CHOLESTOFF PLUS PO) Take 2 tablets by mouth 2 (two) times daily.   05/16/2020 at Unknown time  . vitamin C (ASCORBIC ACID) 500 MG tablet Take 500 mg by mouth daily.   05/16/2020 at Unknown time   Home medication reconciliation was completed with the patient.   Scheduled Inpatient Medications:   . vitamin C  500 mg Oral Daily  . calcium citrate-vitamin D  1 tablet Oral BID  . chlorpheniramine-HYDROcodone  5 mL Oral BID  . influenza vaccine adjuvanted  0.5 mL Intramuscular Tomorrow-1000  . losartan  50 mg Oral Daily  . multivitamin with minerals  1 tablet Oral Daily  . omega-3 acid ethyl esters  1,000 mg Oral Daily  . pantoprazole (PROTONIX) IV  40 mg Intravenous Q12H  . sucralfate  1 g Oral TID WC & HS    Continuous Inpatient Infusions:   . sodium chloride 100 mL/hr at 05/16/20 2307    PRN Inpatient Medications:  acetaminophen **OR** acetaminophen, albuterol, alum & mag hydroxide-simeth, labetalol, morphine injection, ondansetron **OR** ondansetron (ZOFRAN) IV, traZODone  Family History: family history is not on file.  The patient's family history is negative for inflammatory bowel disorders, GI malignancy, or solid organ transplantation.  Social History:   reports that he quit smoking about 46 years ago.  His smoking use included cigarettes. He has a 35.00 pack-year smoking history. He quit smokeless tobacco use about 41 years ago. He reports that he does not drink alcohol and does not use drugs. The patient denies ETOH, tobacco, or drug use.   Review of Systems: Constitutional: Weight is stable.  Eyes: No changes in vision. ENT: No oral lesions, sore throat.  GI: see HPI.  Heme/Lymph: No easy bruising.  CV: No chest pain.  GU: No hematuria.  Integumentary: No rashes.  Neuro: No headaches.  Psych: No depression/anxiety.  Endocrine: No heat/cold intolerance.  Allergic/Immunologic: No urticaria.  Resp: No cough, SOB.  Musculoskeletal: No joint swelling.    Physical Examination: BP (!) 163/92 (BP Location: Right Arm)   Pulse 65   Temp 98.5 F (36.9 C)   Resp 20   Ht 6' (1.829 m)   Wt 88.5 kg   SpO2 97%   BMI 26.45 kg/m  Gen: NAD, alert and oriented x 4 HEENT: PEERLA, EOMI, Neck: supple, no JVD or thyromegaly Chest: CTA bilaterally, no wheezes, crackles, or other adventitious sounds CV: RRR, no  m/g/c/r Abd: soft, NT, ND, +BS in all four quadrants; no HSM, guarding, ridigity, or rebound tenderness Ext: no edema, well perfused with 2+ pulses, Skin: no rash or lesions noted Lymph: no LAD  Data: Lab Results  Component Value Date   WBC 5.3 05/17/2020   HGB 13.8 05/17/2020   HCT 41.0 05/17/2020   MCV 90.5 05/17/2020   PLT 186 05/17/2020   Recent Labs  Lab 05/17/20 0021 05/17/20 0449 05/17/20 1011  HGB 13.1 13.9 13.8   Lab Results  Component Value Date   NA 138 05/17/2020   K 3.9 05/17/2020   CL 109 05/17/2020   CO2 24 05/17/2020   BUN 13 05/17/2020   CREATININE 0.87 05/17/2020   Lab Results  Component Value Date   ALT 14 05/16/2020   AST 21 05/16/2020   ALKPHOS 62 05/16/2020   BILITOT 0.8 05/16/2020   Recent Labs  Lab 05/16/20 1942  APTT 29  INR 1.0   Assessment/Plan:  85 y/o Caucasian male with a PMH of HTN, GERD, and Hx of prostate cancer s/p  chemo and radiation admitted to the hospital for painless hematochezia described as bright red blood mixed with clots  1. Painless hematochezia 2/2 Lower GI bleed - no hematochezia observed over last 14 hours. Likely self-limited diverticular bleed versus proctocolitis 2/2 stool impaction, SRUS. Less likely colon polyp, anal outlet etiology, AVMs, malignancy, etc.  2. GERD - symptoms stable on PPI therapy   Recommendations:  - Clinically insignificant painless hematochezia episodes likely 2/2 self-limited diverticular bleed which has stopped versus proctocolitis 2/2 stool impaction/chronic constipation - H&H stable with no signs of precipitous drop in hemoglobin - Continue to monitor H&H closely and transfuse for Hgb <7.0. - Start soft diet and advance as tolerated - No plans for endoluminal evaluation at this time given no significant hemodynamic changes and no overt hemorrhage - Continue home dose PPI - If tolerates diet and no evidence of observed bleeding, anticipate discharge later today or in the morning - He may follow-up as outpatient in the GI clinic - GI will sign off at this time and follow peripherally  Thank you for the consult. Please call with questions or concerns.  Reeves Forth [facility] [phone] (Cell)

## 2020-05-18 DIAGNOSIS — R112 Nausea with vomiting, unspecified: Secondary | ICD-10-CM

## 2020-05-18 DIAGNOSIS — K5900 Constipation, unspecified: Secondary | ICD-10-CM | POA: Diagnosis not present

## 2020-05-18 DIAGNOSIS — K922 Gastrointestinal hemorrhage, unspecified: Secondary | ICD-10-CM | POA: Diagnosis not present

## 2020-05-18 DIAGNOSIS — K529 Noninfective gastroenteritis and colitis, unspecified: Secondary | ICD-10-CM

## 2020-05-18 MED ORDER — POLYETHYLENE GLYCOL 3350 17 G PO PACK
17.0000 g | PACK | Freq: Every day | ORAL | 0 refills | Status: DC
Start: 2020-05-19 — End: 2023-11-15

## 2020-05-18 NOTE — Progress Notes (Signed)
GI Inpatient Follow-up Note  Subjective:  Patient seen in follow-up for painless hematochezia. No acute events overnight. He was kept overnight yesterday after he had two episodes of vomiting when trying to eat food. No further hematochezia episodes. He was given a bowel regimen with Miralax and Dulcolax suppository to assist with constipation. Pt reports he had bowel movement this morning with no bright red blood. He is feeling well. He denies any dysphagia, odynophagia, abdominal pain, diarrhea, or rectal pain. Daughter and patient are anticipating discharge today.   Scheduled Inpatient Medications:   vitamin C  500 mg Oral Daily   calcium citrate-vitamin D  1 tablet Oral BID   chlorpheniramine-HYDROcodone  5 mL Oral BID   losartan  50 mg Oral Daily   multivitamin with minerals  1 tablet Oral Daily   omega-3 acid ethyl esters  1,000 mg Oral Daily   pantoprazole (PROTONIX) IV  40 mg Intravenous Q12H   polyethylene glycol  17 g Oral Daily   senna-docusate  2 tablet Oral BID   sucralfate  1 g Oral TID WC & HS    Continuous Inpatient Infusions:    sodium chloride Stopped (05/18/20 1300)    PRN Inpatient Medications:  acetaminophen **OR** acetaminophen, albuterol, alum & mag hydroxide-simeth, bisacodyl, labetalol, morphine injection, ondansetron **OR** ondansetron (ZOFRAN) IV, promethazine, traZODone  Review of Systems: Constitutional: Weight is stable.  Eyes: No changes in vision. ENT: No oral lesions, sore throat.  GI: see HPI.  Heme/Lymph: No easy bruising.  CV: No chest pain.  GU: No hematuria.  Integumentary: No rashes.  Neuro: No headaches.  Psych: No depression/anxiety.  Endocrine: No heat/cold intolerance.  Allergic/Immunologic: No urticaria.  Resp: No cough, SOB.  Musculoskeletal: No joint swelling.    Physical Examination: BP (!) 158/90 (BP Location: Right Arm)    Pulse 71    Temp 98.9 F (37.2 C) (Oral)    Resp 20    Ht 6' (1.829 m)    Wt 88.5 kg     SpO2 96%    BMI 26.45 kg/m  Gen: NAD, alert and oriented x 4 HEENT: PEERLA, EOMI, Neck: supple, no JVD or thyromegaly Chest: CTA bilaterally, no wheezes, crackles, or other adventitious sounds CV: RRR, no m/g/c/r Abd: soft, NT, ND, +BS in all four quadrants; no HSM, guarding, ridigity, or rebound tenderness Ext: no edema, well perfused with 2+ pulses, Skin: no rash or lesions noted Lymph: no LAD  Data: Lab Results  Component Value Date   WBC 5.3 05/17/2020   HGB 13.8 05/17/2020   HCT 41.1 05/17/2020   MCV 90.5 05/17/2020   PLT 186 05/17/2020   Recent Labs  Lab 05/17/20 0449 05/17/20 1011 05/17/20 1616  HGB 13.9 13.8 13.8   Lab Results  Component Value Date   NA 138 05/17/2020   K 3.9 05/17/2020   CL 109 05/17/2020   CO2 24 05/17/2020   BUN 13 05/17/2020   CREATININE 0.87 05/17/2020   Lab Results  Component Value Date   ALT 14 05/16/2020   AST 21 05/16/2020   ALKPHOS 62 05/16/2020   BILITOT 0.8 05/16/2020   Recent Labs  Lab 05/16/20 1942  APTT 29  INR 1.0   Assessment/Plan:  85 y/o Caucasian male with a PMH of HTN, GERD, and Hx of prostate cancer s/p chemo and radiation admitted to the hospital for painless hematochezia described as bright red blood mixed with clots  1. Painless hematochezia 2/2 Lower GI bleed - no hematochezia observed over last 14  hours. Likely self-limited diverticular bleed versus proctocolitis 2/2 stool impaction, SRUS. Less likely colon polyp, anal outlet etiology, AVMs, malignancy, etc.  - H&H stable with no recurrent bleeding  2. Nausea and vomiting - likely 2/2 dyspepsia, dietary intolerances, gastritis, PUD +/- H pylori, GERD with reflux/erosive esophagitis, duodenitis, etc  3. GERD - symptoms stable on PPI therapy  4. Chronic constipation - improved s/p bowel regimen  Recommendations:  - H&H stable with no signs of significant anemia or recurrent hematochezia - Dyspepsia symptoms improved with PPI, carafate, and Maalox  prn - No plans for urgent luminal evaluation as inpatient - Continue Protonix 40 mg PO BID for one month then reduce to once daily - He may continue Carafate TID AC - He may continue to use Maalox or Gaviscon as needed 10-20 mL after meals or before bedtime - Discussed avoiding hot, spicy, greasy, acidic, tomato-based food products, citrus fruits for next few weeks - Encouraged small, frequent meals during the day - Follow-up in clinic in 2-3 weeks   Please call with questions or concerns.   Octavia Bruckner, PA-C [facility] [phone] (Cell)

## 2020-05-19 NOTE — Discharge Summary (Signed)
[facility] [place] NAME: Trevor Carlson    MR#:  [id number]  DATE OF BIRTH:  28-Dec-1934  DATE OF ADMISSION:  05/16/2020   ADMITTING PHYSICIAN: Christel Mormon, MD  DATE OF DISCHARGE: 05/18/2020  2:27 PM  PRIMARY CARE PHYSICIAN: Tracie Harrier, MD   ADMISSION DIAGNOSIS:  Proctocolitis [K52.9] GI bleeding [K92.2] Lower GI bleed [K92.2] DISCHARGE DIAGNOSIS:  Active Problems:   Lower GI bleed   Proctocolitis   Nausea and vomiting   Constipation  SECONDARY DIAGNOSIS:   Past Medical History:  Diagnosis Date  . Actinic keratosis   . Arthritis    osteoarthritis right knee  . Basal cell carcinoma 06/2010   L nasal dorsum/med canthus, txted by Dr. Lacinda Axon, Mohs  . History of hiatal hernia   . Hypertension   . Pre-syncope   . Prostate cancer ([place])   . Squamous cell carcinoma of skin 11/2012   SCC IS L ear concha  . Urinary incontinence    HOSPITAL COURSE:  85 year old male with a known history of GERD, hypertension, prostate cancer status post chemo and radiation is admitted for bright red blood per rectum  *Bright red blood per rectum/painless hematochezia -Possible self-limited diverticular bleed and/or proctocolitis from chronic constipation -No further bleeding while here in the hospital.  GI recommends outpatient follow-up  Nausea and vomiting -Could be from constipation, tolerating diet.  Symptomatic management  Acute on chronic constipation -Resolved with bowel regimen  Hypertension Continue losartan 50 mg once daily   DISCHARGE CONDITIONS:  Stable CONSULTS OBTAINED:  Treatment Team:  Efrain Sella, MD DRUG ALLERGIES:  No Known Allergies DISCHARGE MEDICATIONS:   Allergies as of 05/18/2020   No Known Allergies     Medication List    TAKE these medications   albuterol 108 (90 Base) MCG/ACT inhaler Commonly known as: VENTOLIN HFA Inhale 1-2 puffs into the lungs every 6 (six) hours as needed for wheezing or shortness of  breath.   aspirin EC 81 MG tablet Take 81 mg by mouth daily.   calcium citrate-vitamin D 315-200 MG-UNIT tablet Commonly known as: CITRACAL+D Take 1 tablet by mouth 2 (two) times daily.   CHOLESTOFF PLUS PO Take 2 tablets by mouth 2 (two) times daily.   Co Q-10 100 MG Caps Take 1 capsule by mouth daily.   glucosamine-chondroitin 500-400 MG tablet Take 1 tablet by mouth 2 (two) times daily.   losartan 50 MG tablet Commonly known as: COZAAR Take 50 mg by mouth daily.   multivitamin tablet Take 1 tablet by mouth daily.   naproxen sodium 220 MG tablet Commonly known as: ALEVE Take 220 mg by mouth 2 (two) times daily with a meal.   OMEGA-3 FATTY ACIDS PO Take 1,000 mg by mouth daily.   omeprazole 40 MG capsule Commonly known as: PRILOSEC Take 40 mg by mouth daily.   polyethylene glycol 17 g packet Commonly known as: MIRALAX / GLYCOLAX Take 17 g by mouth daily.   vitamin C 500 MG tablet Commonly known as: ASCORBIC ACID Take 500 mg by mouth daily.      DISCHARGE INSTRUCTIONS:   DIET:  Regular diet DISCHARGE CONDITION:  Stable ACTIVITY:  Activity as tolerated OXYGEN:  Home Oxygen: No.  Oxygen Delivery: room air DISCHARGE LOCATION:  home   If you experience worsening of your admission symptoms, develop shortness of breath, life threatening emergency, suicidal or homicidal thoughts you must seek medical attention immediately by calling 911 or calling your MD immediately  if symptoms less severe.  You Must read complete instructions/literature along with all the possible adverse reactions/side effects for all the Medicines you take and that have been prescribed to you. Take any new Medicines after you have completely understood and accpet all the possible adverse reactions/side effects.   Please note  You were cared for by a hospitalist during your hospital stay. If you have any questions about your discharge medications or the care you received while you were  in the hospital after you are discharged, you can call the unit and asked to speak with the hospitalist on call if the hospitalist that took care of you is not available. Once you are discharged, your primary care physician will handle any further medical issues. Please note that NO REFILLS for any discharge medications will be authorized once you are discharged, as it is imperative that you return to your primary care physician (or establish a relationship with a primary care physician if you do not have one) for your aftercare needs so that they can reassess your need for medications and monitor your lab values.    On the day of Discharge:  VITAL SIGNS:  Blood pressure (!) 158/90, pulse 71, temperature 98.9 F (37.2 C), temperature source Oral, resp. rate 20, height 6' (1.829 m), weight 88.5 kg, SpO2 96 %. PHYSICAL EXAMINATION:  GENERAL:  85 y.o.-year-old patient lying in the bed with no acute distress.  EYES: Pupils equal, round, reactive to light and accommodation. No scleral icterus. Extraocular muscles intact.  HEENT: Head atraumatic, normocephalic. Oropharynx and nasopharynx clear.  NECK:  Supple, no jugular venous distention. No thyroid enlargement, no tenderness.  LUNGS: Normal breath sounds bilaterally, no wheezing, rales,rhonchi or crepitation. No use of accessory muscles of respiration.  CARDIOVASCULAR: S1, S2 normal. No murmurs, rubs, or gallops.  ABDOMEN: Soft, non-tender, non-distended. Bowel sounds present. No organomegaly or mass.  EXTREMITIES: No pedal edema, cyanosis, or clubbing.  NEUROLOGIC: Cranial nerves II through XII are intact. Muscle strength 5/5 in all extremities. Sensation intact. Gait not checked.  PSYCHIATRIC: The patient is alert and oriented x 3.  SKIN: No obvious rash, lesion, or ulcer.  DATA REVIEW:   CBC Recent Labs  Lab 05/17/20 0449 05/17/20 1011 05/17/20 1616  WBC 5.3  --   --   HGB 13.9   < > 13.8  HCT 41.1   < > 41.1  PLT 186  --   --    < > =  values in this interval not displayed.    Chemistries  Recent Labs  Lab 05/16/20 1910 05/16/20 1910 05/17/20 0449  NA 138   < > 138  K 4.4   < > 3.9  CL 105   < > 109  CO2 24   < > 24  GLUCOSE 84   < > 90  BUN 16   < > 13  CREATININE 0.93   < > 0.87  CALCIUM 8.6*   < > 8.2*  AST 21  --   --   ALT 14  --   --   ALKPHOS 62  --   --   BILITOT 0.8  --   --    < > = values in this interval not displayed.     Outpatient follow-up  Follow-up Information    Tracie Harrier, MD. Go on 05/19/2020.   Specialty: Internal Medicine Why: 10:16am aapointment Contact information: [address] [place] [address] [phone]  [place], Benay Pike, MD. Go in 2 weeks.   Specialty: Gastroenterology Why: will give patient a call with appointment  Contact information: [place] [address] [phone]                Management plans discussed with the patient, family and they are in agreement.  CODE STATUS: Prior   TOTAL TIME TAKING CARE OF THIS PATIENT: 45 minutes.    Max Sane M.D on 05/19/2020 at 4:39 PM  [facility]   CC: Primary care physician; Tracie Harrier, MD   Note: This dictation was prepared with Dragon dictation along with smaller phrase technology. Any transcriptional errors that result from this process are unintentional.

## 2020-05-25 ENCOUNTER — Encounter: Payer: Self-pay | Admitting: Dermatology

## 2020-05-25 ENCOUNTER — Other Ambulatory Visit: Payer: Self-pay

## 2020-05-25 ENCOUNTER — Ambulatory Visit: Payer: Medicare Other | Admitting: Dermatology

## 2020-05-25 DIAGNOSIS — C44229 Squamous cell carcinoma of skin of left ear and external auricular canal: Secondary | ICD-10-CM | POA: Diagnosis not present

## 2020-05-25 DIAGNOSIS — Z85828 Personal history of other malignant neoplasm of skin: Secondary | ICD-10-CM | POA: Diagnosis not present

## 2020-05-25 DIAGNOSIS — L578 Other skin changes due to chronic exposure to nonionizing radiation: Secondary | ICD-10-CM | POA: Diagnosis not present

## 2020-05-25 DIAGNOSIS — L570 Actinic keratosis: Secondary | ICD-10-CM

## 2020-05-25 DIAGNOSIS — D485 Neoplasm of uncertain behavior of skin: Secondary | ICD-10-CM

## 2020-05-25 NOTE — Progress Notes (Signed)
Follow-Up Visit   Subjective  Trevor Carlson is a 85 y.o. male who presents for the following: Actinic keratosis (patient is here to recheck previously treated areas and check for any new skin lesions on the face and ears). Spot on back of left ear was frozen last visit but came back.  The following portions of the chart were reviewed this encounter and updated as appropriate:     Review of Systems:  No other skin or systemic complaints except as noted in HPI or Assessment and Plan.  Objective  Well appearing patient in no apparent distress; mood and affect are within normal limits.  A focused examination was performed including the face and ears. Relevant physical exam findings are noted in the Assessment and Plan.  Objective  L nasal ala x 2, nasal dorsum x 1 (3): Erythematous thin papules/macules with gritty scale.   Objective  L post ear: 0.5 cm pink pearly papule   Assessment & Plan  AK (actinic keratosis) (3) L nasal ala x 2, nasal dorsum x 1  Destruction of lesion - L nasal ala x 2, nasal dorsum x 1  Destruction method: cryotherapy   Informed consent: discussed and consent obtained   Timeout:  patient name, date of birth, surgical site, and procedure verified Lesion destroyed using liquid nitrogen: Yes   Region frozen until ice ball extended beyond lesion: Yes   Outcome: patient tolerated procedure well with no complications   Post-procedure details: wound care instructions given    Neoplasm of uncertain behavior of skin L post ear  Epidermal / dermal shaving  Lesion diameter (cm):  0.5 Informed consent: discussed and consent obtained   Patient was prepped and draped in usual sterile fashion: area prepped with alcohol. Anesthesia: the lesion was anesthetized in a standard fashion   Anesthetic:  1% lidocaine w/ epinephrine 1-100,000 buffered w/ 8.4% NaHCO3 Instrument used: flexible razor blade   Hemostasis achieved with: pressure and aluminum chloride   Outcome:  patient tolerated procedure well    Destruction of lesion  Destruction method: electrodesiccation and curettage   Informed consent: discussed and consent obtained   Timeout:  patient name, date of birth, surgical site, and procedure verified Curettage performed in three different directions: Yes   Electrodesiccation performed over the curetted area: Yes   Lesion length (cm):  0.5 Lesion width (cm):  0.5 Final wound size (cm):  0.6 Hemostasis achieved with:  pressure, aluminum chloride and electrodesiccation Outcome: patient tolerated procedure well with no complications   Post-procedure details: wound care instructions given   Additional details:  Mupirocin ointment and Bandaid applied    Specimen 1 - Surgical pathology Differential Diagnosis: D48.5 r/o BCC Check Margins: No 0.5 cm pink pearly papule  R/O BCC     Actinic Damage - chronic, secondary to cumulative UV radiation exposure/sun exposure over time - diffuse scaly erythematous macules with underlying dyspigmentation - Recommend daily broad spectrum sunscreen SPF 30+ to sun-exposed areas, reapply every 2 hours as needed.  - Call for new or changing lesions.  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  - Call if any new or changing lesions are noted between office visits   Return in about 6 months (around 11/22/2020) for UBSE.  Luther Redo, CMA, am acting as scribe for [name], MD  .  Documentation: I have reviewed the above documentation for accuracy and completeness, and I agree with the above.  [name] MD

## 2020-05-25 NOTE — Patient Instructions (Signed)

## 2020-05-26 ENCOUNTER — Telehealth: Payer: Self-pay

## 2020-05-26 NOTE — Telephone Encounter (Signed)
-----   Message from [name], MD sent at 05/26/2020  3:48 PM EST ----- Skin , left post ear WELL DIFFERENTIATED SQUAMOUS CELL CARCINOMA  SCC skin cancer- already treated with [facility]

## 2020-05-26 NOTE — Telephone Encounter (Signed)
Advised patient bx was SCC and has already been treated with [facility].

## 2020-11-03 ENCOUNTER — Other Ambulatory Visit: Payer: Self-pay

## 2020-11-03 ENCOUNTER — Ambulatory Visit: Payer: Medicare Other | Admitting: Dermatology

## 2020-11-03 DIAGNOSIS — L720 Epidermal cyst: Secondary | ICD-10-CM | POA: Diagnosis not present

## 2020-11-03 DIAGNOSIS — Z86007 Personal history of in-situ neoplasm of skin: Secondary | ICD-10-CM | POA: Diagnosis not present

## 2020-11-03 DIAGNOSIS — L821 Other seborrheic keratosis: Secondary | ICD-10-CM

## 2020-11-03 DIAGNOSIS — L820 Inflamed seborrheic keratosis: Secondary | ICD-10-CM

## 2020-11-03 DIAGNOSIS — L814 Other melanin hyperpigmentation: Secondary | ICD-10-CM

## 2020-11-03 DIAGNOSIS — D1800 Hemangioma unspecified site: Secondary | ICD-10-CM

## 2020-11-03 DIAGNOSIS — L578 Other skin changes due to chronic exposure to nonionizing radiation: Secondary | ICD-10-CM

## 2020-11-03 DIAGNOSIS — Z85828 Personal history of other malignant neoplasm of skin: Secondary | ICD-10-CM | POA: Diagnosis not present

## 2020-11-03 DIAGNOSIS — D229 Melanocytic nevi, unspecified: Secondary | ICD-10-CM

## 2020-11-03 DIAGNOSIS — Z1283 Encounter for screening for malignant neoplasm of skin: Secondary | ICD-10-CM | POA: Diagnosis not present

## 2020-11-03 NOTE — Progress Notes (Signed)
Follow-Up Visit   Subjective  Trevor Carlson is a 86 y.o. male who presents for the following: upper body exam (Patient here today for upper body exam. He has history of scc of L ear. He reports concerns with spots on left and right side of face, ears, behind ears, and shoulders he would like checked today. ).  They have been itchy and irritated. Also h/o BCC L medial canthus treated with Mohs 2012  Patient here for upper body skin exam and skin cancer screening.   The following portions of the chart were reviewed this encounter and updated as appropriate:      Objective  Well appearing patient in no apparent distress; mood and affect are within normal limits.  A focused examination was performed including upper extremities, including the arms, hands, fingers, and fingernails and face, back. Relevant physical exam findings are noted in the Assessment and Plan.  Objective  Left Preauricular Area x 2, left postauricular x 2, left anterior shoulder x 2, right preauricular x 1, left antihelix x 1, (8): Erythematous keratotic or waxy stuck-on papule  Objective  Right Upper Back: Subcutaneous nodule.   Assessment & Plan  Inflamed seborrheic keratosis (8) Left Preauricular Area x 2, left postauricular x 2, left anterior shoulder x 2, right preauricular x 1, left antihelix x 1,  Prior to procedure, discussed risks of blister formation, small wound, skin dyspigmentation, or rare scar following cryotherapy.    Destruction of lesion - Left Preauricular Area x 2, left postauricular x 2, left anterior shoulder x 2, right preauricular x 1, left antihelix x 1,  Destruction method: cryotherapy   Informed consent: discussed and consent obtained   Lesion destroyed using liquid nitrogen: Yes   Region frozen until ice ball extended beyond lesion: Yes   Outcome: patient tolerated procedure well with no complications   Post-procedure details: wound care instructions given    Epidermal inclusion  cyst Right Upper Back  Benign-appearing. Exam most consistent with an epidermal inclusion cyst. Discussed that a cyst is a benign growth that can grow over time and sometimes get irritated or inflamed. Recommend observation if it is not bothersome. Discussed option of surgical excision to remove it if it is growing, symptomatic, or other changes noted. Please call for new or changing lesions so they can be evaluated.    Lentigines - Scattered tan macules - Due to sun exposure - Benign-appering, observe - Recommend daily broad spectrum sunscreen SPF 30+ to sun-exposed areas, reapply every 2 hours as needed. - Call for any changes  Seborrheic Keratoses - Stuck-on, waxy, tan-brown papules and/or plaques  - Benign-appearing - Discussed benign etiology and prognosis. - Observe - Call for any changes  Melanocytic Nevi - Tan-brown and/or pink-flesh-colored symmetric macules and papules - Benign appearing on exam today - Observation - Call clinic for new or changing moles - Recommend daily use of broad spectrum spf 30+ sunscreen to sun-exposed areas.   Hemangiomas - Red papules - Discussed benign nature - Observe - Call for any changes  Actinic Damage - Chronic condition, secondary to cumulative UV/sun exposure - diffuse scaly erythematous macules with underlying dyspigmentation - Recommend daily broad spectrum sunscreen SPF 30+ to sun-exposed areas, reapply every 2 hours as needed.  - Staying in the shade or wearing long sleeves, sun glasses (UVA+UVB protection) and wide brim hats (4-inch brim around the entire circumference of the hat) are also recommended for sun protection.  - Call for new or changing lesions.  History of  Squamous Cell Carcinoma in Situ of the Skin - No evidence of recurrence today left ear concha (2014) - Recommend regular full body skin exams - Recommend daily broad spectrum sunscreen SPF 30+ to sun-exposed areas, reapply every 2 hours as needed.  - Call if  any new or changing lesions are noted between office visits  History of Squamous Cell Carcinoma of the Skin - No evidence of recurrence today on left posterior ear (2021) - Recommend regular full body skin exams - Recommend daily broad spectrum sunscreen SPF 30+ to sun-exposed areas, reapply every 2 hours as needed.  - Call if any new or changing lesions are noted between office visits   Skin cancer screening performed today.  Return in about 6 months (around 05/06/2021) for recheck sun exposed areas, Check L ear- h/o SCC.  I, Ruthell Rummage, CMA, am acting as scribe for [name], MD.  Documentation: I have reviewed the above documentation for accuracy and completeness, and I agree with the above.  [name] MD

## 2020-11-03 NOTE — Patient Instructions (Signed)
Cryotherapy Aftercare  . Wash gently with soap and water everyday.   . Apply Vaseline and Band-Aid daily until healed.   Melanoma ABCDEs  Melanoma is the most dangerous type of skin cancer, and is the leading cause of death from skin disease.  You are more likely to develop melanoma if you:  Have light-colored skin, light-colored eyes, or red or blond hair  Spend a lot of time in the sun  Tan regularly, either outdoors or in a tanning bed  Have had blistering sunburns, especially during childhood  Have a close family member who has had a melanoma  Have atypical moles or large birthmarks  Early detection of melanoma is key since treatment is typically straightforward and cure rates are extremely high if we catch it early.   The first sign of melanoma is often a change in a mole or a new dark spot.  The ABCDE system is a way of remembering the signs of melanoma.  A for asymmetry:  The two halves do not match. B for border:  The edges of the growth are irregular. C for color:  A mixture of colors are present instead of an even brown color. D for diameter:  Melanomas are usually (but not always) greater than 6mm - the size of a pencil eraser. E for evolution:  The spot keeps changing in size, shape, and color.  Please check your skin once per month between visits. You can use a small mirror in front and a large mirror behind you to keep an eye on the back side or your body.   If you see any new or changing lesions before your next follow-up, please call to schedule a visit.  Please continue daily skin protection including broad spectrum sunscreen SPF 30+ to sun-exposed areas, reapplying every 2 hours as needed when you're outdoors.   Staying in the shade or wearing long sleeves, sun glasses (UVA+UVB protection) and wide brim hats (4-inch brim around the entire circumference of the hat) are also recommended for sun protection.     If you have any questions or concerns for your  doctor, please call our main line at [phone] and press option 4 to reach your doctor's medical assistant. If no one answers, please leave a voicemail as directed and we will return your call as soon as possible. Messages left after 4 pm will be answered the following business day.   You may also send us a message via MyChart. We typically respond to MyChart messages within 1-2 business days.  For prescription refills, please ask your pharmacy to contact our office. Our fax number is [phone].  If you have an urgent issue when the clinic is closed that cannot wait until the next business day, you can page your doctor at the number below.    Please note that while we do our best to be available for urgent issues outside of office hours, we are not available 24/7.   If you have an urgent issue and are unable to reach us, you may choose to seek medical care at your doctor's office, retail clinic, urgent care center, or emergency room.  If you have a medical emergency, please immediately call 911 or go to the emergency department.  Pager Numbers  - Dr. Kowalski: [phone]  - Dr. Moye: [phone]  - Dr. Stewart: [phone]  In the event of inclement weather, please call our main line at [phone] for an update on the status of any delays or closures.    Dermatology Medication Tips: Please keep the boxes that topical medications come in in order to help keep track of the instructions about where and how to use these. Pharmacies typically print the medication instructions only on the boxes and not directly on the medication tubes.   If your medication is too expensive, please contact our office at [phone] option 4 or send us a message through MyChart.   We are unable to tell what your co-pay for medications will be in advance as this is different depending on your insurance coverage. However, we may be able to find a substitute medication at lower cost or fill out paperwork  to get insurance to cover a needed medication.   If a prior authorization is required to get your medication covered by your insurance company, please allow us 1-2 business days to complete this process.  Drug prices often vary depending on where the prescription is filled and some pharmacies may offer cheaper prices.  The website www.goodrx.com contains coupons for medications through different pharmacies. The prices here do not account for what the cost may be with help from insurance (it may be cheaper with your insurance), but the website can give you the price if you did not use any insurance.  - You can print the associated coupon and take it with your prescription to the pharmacy.  - You may also stop by our office during regular business hours and pick up a GoodRx coupon card.  - If you need your prescription sent electronically to a different pharmacy, notify our office through [place] MyChart or by phone at [phone] option 4.  

## 2021-01-13 ENCOUNTER — Ambulatory Visit: Payer: Medicare Other | Admitting: Dermatology

## 2021-01-13 ENCOUNTER — Other Ambulatory Visit: Payer: Self-pay

## 2021-01-13 DIAGNOSIS — L578 Other skin changes due to chronic exposure to nonionizing radiation: Secondary | ICD-10-CM | POA: Diagnosis not present

## 2021-01-13 DIAGNOSIS — L820 Inflamed seborrheic keratosis: Secondary | ICD-10-CM | POA: Diagnosis not present

## 2021-01-13 DIAGNOSIS — B078 Other viral warts: Secondary | ICD-10-CM

## 2021-01-13 DIAGNOSIS — L570 Actinic keratosis: Secondary | ICD-10-CM

## 2021-01-13 NOTE — Patient Instructions (Addendum)

## 2021-01-13 NOTE — Progress Notes (Signed)
Follow-Up Visit   Subjective  Trevor Carlson is a 86 y.o. male who presents for the following: Skin Problem (Pt c/o several irregular growths popping up on his lip, hands and legs and will not go away, hx of skin cancer ).  The following portions of the chart were reviewed this encounter and updated as appropriate:   Tobacco  Allergies  Meds  Problems  Med Hx  Surg Hx  Fam Hx     Review of Systems:  No other skin or systemic complaints except as noted in HPI or Assessment and Plan.  Objective  Well appearing patient in no apparent distress; mood and affect are within normal limits.  A focused examination was performed including face, hands, legs. Relevant physical exam findings are noted in the Assessment and Plan.  left lateral elbow x 1, right distal lateral pretibial x 1, left dorsum hand x 1, right sideburn x 1 (4) 0.7 cm Erythematous keratotic or waxy stuck-on papule   Left Ear x 1, right ear x 1 (2) Erythematous thin papules/macules with gritty scale.   right lower lip 0.7 cm Verrucous papule -- Discussed viral etiology and contagion.         Assessment & Plan  Inflamed seborrheic keratosis left lateral elbow x 1, right distal lateral pretibial x 1, left dorsum hand x 1, right sideburn x 1  Reassured benign age-related growth.  Recommend observation.  Discussed cryotherapy if spot(s) become irritated or inflamed.   Destruction of lesion - left lateral elbow x 1, right distal lateral pretibial x 1, left dorsum hand x 1, right sideburn x 1 Complexity: simple   Destruction method: cryotherapy   Informed consent: discussed and consent obtained   Timeout:  patient name, date of birth, surgical site, and procedure verified Lesion destroyed using liquid nitrogen: Yes   Region frozen until ice ball extended beyond lesion: Yes   Outcome: patient tolerated procedure well with no complications   Post-procedure details: wound care instructions given    AK (actinic  keratosis) (2) Left Ear x 1, right ear x 1  Actinic keratoses are precancerous spots that appear secondary to cumulative UV radiation exposure/sun exposure over time. They are chronic with expected duration over 1 year. A portion of actinic keratoses will progress to squamous cell carcinoma of the skin. It is not possible to reliably predict which spots will progress to skin cancer and so treatment is recommended to prevent development of skin cancer.  Recommend daily broad spectrum sunscreen SPF 30+ to sun-exposed areas, reapply every 2 hours as needed.  Recommend staying in the shade or wearing long sleeves, sun glasses (UVA+UVB protection) and wide brim hats (4-inch brim around the entire circumference of the hat). Call for new or changing lesions.   Destruction of lesion - Left Ear x 1, right ear x 1 Complexity: simple   Destruction method: cryotherapy   Informed consent: discussed and consent obtained   Timeout:  patient name, date of birth, surgical site, and procedure verified Lesion destroyed using liquid nitrogen: Yes   Region frozen until ice ball extended beyond lesion: Yes   Outcome: patient tolerated procedure well with no complications   Post-procedure details: wound care instructions given    Other viral warts right lower lip Viral wart vs ISK  Recheck in 6-8 weeks -if not resolved will consider possibility of squamous cell carcinoma and will consider biopsy.  Patient to call if enlarges.  Destruction of lesion - right lower lip Complexity: simple  Destruction method: cryotherapy   Informed consent: discussed and consent obtained   Timeout:  patient name, date of birth, surgical site, and procedure verified Lesion destroyed using liquid nitrogen: Yes   Region frozen until ice ball extended beyond lesion: Yes   Outcome: patient tolerated procedure well with no complications   Post-procedure details: wound care instructions given    Actinic Damage - chronic, secondary  to cumulative UV radiation exposure/sun exposure over time - diffuse scaly erythematous macules with underlying dyspigmentation - Recommend daily broad spectrum sunscreen SPF 30+ to sun-exposed areas, reapply every 2 hours as needed.  - Recommend staying in the shade or wearing long sleeves, sun glasses (UVA+UVB protection) and wide brim hats (4-inch brim around the entire circumference of the hat). - Call for new or changing lesions.   Return in about 8 weeks (around 03/10/2021) for 6-8 weeks recheck AKs, ISK, recheck right lower lip .  IMarye Round, CMA, am acting as scribe for Sarina Ser, MD .  Documentation: I have reviewed the above documentation for accuracy and completeness, and I agree with the above.  Sarina Ser, MD

## 2021-01-14 ENCOUNTER — Encounter: Payer: Self-pay | Admitting: Dermatology

## 2021-02-02 ENCOUNTER — Encounter: Payer: Self-pay | Admitting: Ophthalmology

## 2021-02-05 NOTE — Discharge Instructions (Signed)

## 2021-02-09 ENCOUNTER — Ambulatory Visit: Payer: Medicare Other | Admitting: Anesthesiology

## 2021-02-09 ENCOUNTER — Ambulatory Visit
Admission: RE | Admit: 2021-02-09 | Discharge: 2021-02-09 | Disposition: A | Discharge status: Home / Self Care | Payer: Medicare Other | Attending: Ophthalmology | Admitting: Ophthalmology

## 2021-02-09 ENCOUNTER — Other Ambulatory Visit: Payer: Self-pay

## 2021-02-09 ENCOUNTER — Encounter
Admission: RE | Disposition: A | Discharge status: Home / Self Care | Payer: Self-pay | Source: Home / Self Care | Attending: Ophthalmology

## 2021-02-09 ENCOUNTER — Encounter: Payer: Self-pay | Admitting: Ophthalmology

## 2021-02-09 DIAGNOSIS — Z85828 Personal history of other malignant neoplasm of skin: Secondary | ICD-10-CM | POA: Insufficient documentation

## 2021-02-09 DIAGNOSIS — Z7982 Long term (current) use of aspirin: Secondary | ICD-10-CM | POA: Diagnosis not present

## 2021-02-09 DIAGNOSIS — H2511 Age-related nuclear cataract, right eye: Secondary | ICD-10-CM | POA: Diagnosis present

## 2021-02-09 DIAGNOSIS — Z8546 Personal history of malignant neoplasm of prostate: Secondary | ICD-10-CM | POA: Diagnosis not present

## 2021-02-09 DIAGNOSIS — Z79899 Other long term (current) drug therapy: Secondary | ICD-10-CM | POA: Diagnosis not present

## 2021-02-09 HISTORY — DX: Presence of dental prosthetic device (complete) (partial): Z97.2

## 2021-02-09 HISTORY — PX: CATARACT EXTRACTION W/PHACO: SHX586

## 2021-02-09 SURGERY — PHACOEMULSIFICATION, CATARACT, WITH IOL INSERTION
Anesthesia: Monitor Anesthesia Care | Site: Eye | Laterality: Right

## 2021-02-09 MED ORDER — SIGHTPATH DOSE#1 BSS IO SOLN
INTRAOCULAR | Status: DC | PRN
  Administered 2021-02-09: 86 mL via OPHTHALMIC

## 2021-02-09 MED ORDER — MOXIFLOXACIN HCL 0.5 % OP SOLN
OPHTHALMIC | Status: DC | PRN
  Administered 2021-02-09: 0.2 mL via OPHTHALMIC

## 2021-02-09 MED ORDER — SIGHTPATH DOSE#1 SODIUM HYALURONATE 10 MG/ML IO SOLUTION
PREFILLED_SYRINGE | INTRAOCULAR | Status: DC | PRN
  Administered 2021-02-09: 0.55 mL via INTRAOCULAR

## 2021-02-09 MED ORDER — SIGHTPATH DOSE#1 BSS IO SOLN
INTRAOCULAR | Status: DC | PRN
  Administered 2021-02-09: 15 mL

## 2021-02-09 MED ORDER — SIGHTPATH DOSE#1 SODIUM HYALURONATE 23 MG/ML IO SOLUTION
PREFILLED_SYRINGE | INTRAOCULAR | Status: DC | PRN
  Administered 2021-02-09: .6 mL via INTRAOCULAR

## 2021-02-09 MED ORDER — LACTATED RINGERS IV SOLN: INTRAVENOUS | Status: DC

## 2021-02-09 MED ORDER — OXYCODONE HCL 5 MG/5ML PO SOLN
5.0000 mg | Freq: Once | ORAL | Status: DC | PRN
Start: 2021-02-09 — End: 2021-02-09

## 2021-02-09 MED ORDER — MIDAZOLAM HCL 2 MG/2ML IJ SOLN
INTRAMUSCULAR | Status: DC | PRN
  Administered 2021-02-09: 1 mg via INTRAVENOUS

## 2021-02-09 MED ORDER — LIDOCAINE HCL (PF) 2 % IJ SOLN
INTRAOCULAR | Status: DC | PRN
  Administered 2021-02-09: 1 mL via INTRAOCULAR

## 2021-02-09 MED ORDER — CYCLOPENTOLATE HCL 2 % OP SOLN
1.0000 [drp] | OPHTHALMIC | Status: DC | PRN
  Administered 2021-02-09 (×3): 1 [drp] via OPHTHALMIC

## 2021-02-09 MED ORDER — FENTANYL CITRATE (PF) 100 MCG/2ML IJ SOLN
INTRAMUSCULAR | Status: DC | PRN
  Administered 2021-02-09: 50 ug via INTRAVENOUS

## 2021-02-09 MED ORDER — OXYCODONE HCL 5 MG PO TABS: 5.0000 mg | ORAL_TABLET | Freq: Once | ORAL | Status: DC | PRN

## 2021-02-09 MED ORDER — PHENYLEPHRINE HCL 10 % OP SOLN
1.0000 [drp] | OPHTHALMIC | Status: DC | PRN
  Administered 2021-02-09 (×3): 1 [drp] via OPHTHALMIC

## 2021-02-09 MED ORDER — TETRACAINE HCL 0.5 % OP SOLN
1.0000 [drp] | OPHTHALMIC | Status: DC | PRN
  Administered 2021-02-09 (×3): 1 [drp] via OPHTHALMIC

## 2021-02-09 SURGICAL SUPPLY — 14 items
CANNULA ANT/CHMB 27GA (MISCELLANEOUS) ×4 IMPLANT
DISSECTOR HYDRO NUCLEUS 50X22 (MISCELLANEOUS) ×2 IMPLANT
GLOVE SURG ENC TEXT LTX SZ7.5 (GLOVE) ×2 IMPLANT
GLOVE SURG SYN 8.5  E (GLOVE) ×1
GLOVE SURG SYN 8.5 E (GLOVE) ×1 IMPLANT
GOWN STRL REUS W/ TWL LRG LVL3 (GOWN DISPOSABLE) ×2 IMPLANT
GOWN STRL REUS W/TWL LRG LVL3 (GOWN DISPOSABLE) ×4
LENS IOL TECNIS EYHANCE 26.5 (Intraocular Lens) ×2 IMPLANT
MARKER SKIN DUAL TIP RULER LAB (MISCELLANEOUS) ×2 IMPLANT
PACK EYE AFTER SURG (MISCELLANEOUS) ×2 IMPLANT
SYR 3ML LL SCALE MARK (SYRINGE) ×2 IMPLANT
SYR TB 1ML LUER SLIP (SYRINGE) ×2 IMPLANT
WATER STERILE IRR 250ML POUR (IV SOLUTION) ×2 IMPLANT
WIPE NON LINTING 3.25X3.25 (MISCELLANEOUS) ×2 IMPLANT

## 2021-02-09 NOTE — Anesthesia Procedure Notes (Signed)
Procedure Name: MAC Date/Time: 02/09/2021 9:52 AM Performed by: Dionne Bucy, CRNA Pre-anesthesia Checklist: Patient identified, Emergency Drugs available, Suction available, Patient being monitored and Timeout performed Patient Re-evaluated:Patient Re-evaluated prior to induction Oxygen Delivery Method: Nasal cannula Placement Confirmation: positive ETCO2

## 2021-02-09 NOTE — Transfer of Care (Signed)
Immediate Anesthesia Transfer of Care Note  Patient: Trevor Carlson  Procedure(s) Performed: CATARACT EXTRACTION PHACO AND INTRAOCULAR LENS PLACEMENT (IOC) RIGHT 9.25 00:50.2 (Right: Eye)  Patient Location: PACU  Anesthesia Type: MAC  Level of Consciousness: awake, alert  and patient cooperative  Airway and Oxygen Therapy: Patient Spontanous Breathing and Patient connected to supplemental oxygen  Post-op Assessment: Post-op Vital signs reviewed, Patient's Cardiovascular Status Stable, Respiratory Function Stable, Patent Airway and No signs of Nausea or vomiting  Post-op Vital Signs: Reviewed and stable  Complications: No notable events documented.

## 2021-02-09 NOTE — Anesthesia Preprocedure Evaluation (Signed)
Anesthesia Evaluation  Patient identified by MRN, date of birth, ID band Patient awake    Reviewed: NPO status   History of Anesthesia Complications Negative for: history of anesthetic complications  Airway Mallampati: II  TM Distance: >3 FB Neck ROM: full    Dental  (+) Partial Lower   Pulmonary neg pulmonary ROS, former smoker,    Pulmonary exam normal        Cardiovascular Exercise Tolerance: Good hypertension, Normal cardiovascular exam     Neuro/Psych Numbness of left hand:    negative psych ROS   GI/Hepatic Neg liver ROS, GERD  Controlled,  Endo/Other  negative endocrine ROS  Renal/GU negative Renal ROS  negative genitourinary   Musculoskeletal  (+) Arthritis ,   Abdominal   Peds  Hematology Ca prostate- Hx of Prostatectomy 1998    Anesthesia Other Findings pcp: Azzie Glatter, MD at 10/14/2020 ;   Reproductive/Obstetrics                             Anesthesia Physical Anesthesia Plan  ASA: 2  Anesthesia Plan: MAC   Post-op Pain Management:    Induction:   PONV Risk Score and Plan: 1 and TIVA  Airway Management Planned:   Additional Equipment:   Intra-op Plan:   Post-operative Plan:   Informed Consent: I have reviewed the patients History and Physical, chart, labs and discussed the procedure including the risks, benefits and alternatives for the proposed anesthesia with the patient or authorized representative who has indicated his/her understanding and acceptance.       Plan Discussed with: CRNA  Anesthesia Plan Comments:         Anesthesia Quick Evaluation

## 2021-02-09 NOTE — Anesthesia Postprocedure Evaluation (Signed)
Anesthesia Post Note  Patient: Trevor Carlson  Procedure(s) Performed: CATARACT EXTRACTION PHACO AND INTRAOCULAR LENS PLACEMENT (IOC) RIGHT 9.25 00:50.2 (Right: Eye)     Patient location during evaluation: PACU Anesthesia Type: MAC Level of consciousness: awake and alert Pain management: pain level controlled Vital Signs Assessment: post-procedure vital signs reviewed and stable Respiratory status: spontaneous breathing, nonlabored ventilation, respiratory function stable and patient connected to nasal cannula oxygen Cardiovascular status: stable and blood pressure returned to baseline Postop Assessment: no apparent nausea or vomiting Anesthetic complications: no   No notable events documented.  Fidel Levy

## 2021-02-09 NOTE — Op Note (Signed)
OPERATIVE NOTE  Trevor Carlson [id number] 02/09/2021   PREOPERATIVE DIAGNOSIS:  Nuclear sclerotic cataract right eye.  H25.11   POSTOPERATIVE DIAGNOSIS:    Nuclear sclerotic cataract right eye.     PROCEDURE:  Phacoemusification with posterior chamber intraocular lens placement of the right eye   LENS:   Implant Name Type Inv. Item Serial No. Manufacturer Lot No. LRB No. Used Action  LENS IOL TECNIS EYHANCE 26.5 - [id number] Intraocular Lens LENS IOL TECNIS EYHANCE 26.5 [id number] JOHNSON   Right 1 Implanted       Procedure(s): CATARACT EXTRACTION PHACO AND INTRAOCULAR LENS PLACEMENT (IOC) RIGHT 9.25 00:50.2 (Right)  DIB00 +26.5   ULTRASOUND TIME: 0 minutes 50 seconds.  CDE 9.25   SURGEON:  Benay Pillow, MD, MPH  ANESTHESIOLOGIST: Anesthesiologist: Fidel Levy, MD CRNA: Dionne Bucy, CRNA   ANESTHESIA:  Topical with tetracaine drops augmented with 1% preservative-free intracameral lidocaine.  ESTIMATED BLOOD LOSS: less than 1 mL.   COMPLICATIONS:  None.   DESCRIPTION OF PROCEDURE:  The patient was identified in the holding room and transported to the operating room and placed in the supine position under the operating microscope.  The right eye was identified as the operative eye and it was prepped and draped in the usual sterile ophthalmic fashion.   A 1.0 millimeter clear-corneal paracentesis was made at the 10:30 position. 0.5 ml of preservative-free 1% lidocaine with epinephrine was injected into the anterior chamber.  The anterior chamber was filled with Healon 5 viscoelastic.  A 2.4 millimeter keratome was used to make a near-clear corneal incision at the 8:00 position.  A curvilinear capsulorrhexis was made with a cystotome and capsulorrhexis forceps.  Balanced salt solution was used to hydrodissect and hydrodelineate the nucleus.   Phacoemulsification was then used in stop and chop fashion to remove the lens nucleus and epinucleus.  The remaining cortex was then  removed using the irrigation and aspiration handpiece. Healon was then placed into the capsular bag to distend it for lens placement.  A lens was then injected into the capsular bag.  The remaining viscoelastic was aspirated.   Wounds were hydrated with balanced salt solution.  The anterior chamber was inflated to a physiologic pressure with balanced salt solution.   Intracameral vigamox 0.1 mL undiluted was injected into the eye and a drop placed onto the ocular surface.  No wound leaks were noted.  The patient was taken to the recovery room in stable condition without complications of anesthesia or surgery  Benay Pillow 02/09/2021, 10:13 AM

## 2021-02-09 NOTE — H&P (Signed)
[place]   Primary Care Physician:  Tracie Harrier, MD Ophthalmologist: Dr. Benay Pillow  Pre-Procedure History & Physical: HPI:  Trevor Carlson is a 86 y.o. male here for cataract surgery.   Past Medical History:  Diagnosis Date   Actinic keratosis    Arthritis    osteoarthritis right knee   Basal cell carcinoma 06/2010   L nasal dorsum/med canthus, txted by Dr. Lacinda Axon, Mohs   History of hiatal hernia    Hypertension    Pre-syncope    Prostate cancer ([place])    Squamous cell carcinoma of skin 11/2012   SCC IS L ear concha   Squamous cell carcinoma of skin 05/25/2020   L post ear, EDC   Urinary incontinence    Wears partial dentures     Past Surgical History:  Procedure Laterality Date   COLONOSCOPY  2015   dental implants     ESOPHAGOGASTRODUODENOSCOPY (EGD) WITH PROPOFOL N/A 12/06/2016   Procedure: ESOPHAGOGASTRODUODENOSCOPY (EGD) WITH PROPOFOL;  Surgeon: Lollie Sails, MD;  Location: [facility] ENDOSCOPY;  Service: Endoscopy;  Laterality: N/A;   ESOPHAGOGASTRODUODENOSCOPY (EGD) WITH PROPOFOL N/A 01/10/2017   Procedure: ESOPHAGOGASTRODUODENOSCOPY (EGD) WITH PROPOFOL;  Surgeon: Lollie Sails, MD;  Location: [facility] ENDOSCOPY;  Service: Endoscopy;  Laterality: N/A;   PROSTATE SURGERY      Prior to Admission medications   Medication Sig Start Date End Date Taking? Authorizing Provider  aspirin EC 81 MG tablet Take 81 mg by mouth daily.   Yes [provider]  calcium citrate-vitamin D (CITRACAL+D) 315-200 MG-UNIT tablet Take 1 tablet by mouth 2 (two) times daily.   Yes [provider]  Coenzyme Q10 (CO Q-10) 100 MG CAPS Take 1 capsule by mouth daily.   Yes [provider]  glucosamine-chondroitin 500-400 MG tablet Take 1 tablet by mouth 2 (two) times daily.   Yes [provider]  losartan (COZAAR) 50 MG tablet Take 50 mg by mouth daily.   Yes [provider]  Multiple Vitamin (MULTIVITAMIN) tablet Take 1 tablet by mouth  daily.   Yes [provider]  OMEGA-3 FATTY ACIDS PO Take 1,000 mg by mouth daily.   Yes [provider]  omeprazole (PRILOSEC) 40 MG capsule Take 40 mg by mouth daily.   Yes [provider]  Plant Sterols and Stanols (CHOLESTOFF PLUS PO) Take 2 tablets by mouth 2 (two) times daily.   Yes [provider]  polyethylene glycol (MIRALAX / GLYCOLAX) 17 g packet Take 17 g by mouth daily. 05/19/20  Yes Max Sane, MD  vitamin B-12 (CYANOCOBALAMIN) 1000 MCG tablet Take 1,000 mcg by mouth daily.   Yes [provider]  albuterol (PROVENTIL HFA;VENTOLIN HFA) 108 (90 Base) MCG/ACT inhaler Inhale 1-2 puffs into the lungs every 6 (six) hours as needed for wheezing or shortness of breath. Patient not taking: Reported on 02/02/2021    [provider]  naproxen sodium (ANAPROX) 220 MG tablet Take 220 mg by mouth 2 (two) times daily with a meal. Patient not taking: Reported on 02/02/2021    [provider]  vitamin C (ASCORBIC ACID) 500 MG tablet Take 500 mg by mouth daily. Patient not taking: Reported on 02/02/2021    [provider]    Allergies as of 01/01/2021   (No Known Allergies)    History reviewed. No pertinent family history.  Social History   Socioeconomic History   Marital status: Widowed    Spouse name: Not on file   Number of children:  Not on file   Years of education: Not on file   Highest education level: Not on file  Occupational History   Not on file  Tobacco Use   Smoking status: Former    Packs/day: 1.00    Years: 35.00    Pack years: 35.00    Types: Cigarettes    Quit date: 03/26/1974    Years since quitting: 46.9   Smokeless tobacco: Former    Quit date: 03/27/1979  Vaping Use   Vaping Use: Never used  Substance and Sexual Activity   Alcohol use: No   Drug use: Never   Sexual activity: Not on file  Other Topics Concern   Not on file  Social History Narrative   Not on file   Social Determinants of  Health   Financial Resource Strain: Not on file  Food Insecurity: Not on file  Transportation Needs: Not on file  Physical Activity: Not on file  Stress: Not on file  Social Connections: Not on file  Intimate Partner Violence: Not on file    Review of Systems: See HPI, otherwise negative ROS  Physical Exam: BP (!) 156/88   Pulse 63   Temp 97.7 F (36.5 C) (Temporal)   Resp 16   Ht 6' (1.829 m)   Wt 87.5 kg   SpO2 98%   BMI 26.18 kg/m  General:   Alert, cooperative in NAD Head:  Normocephalic and atraumatic. Respiratory:  Normal work of breathing. Cardiovascular:  RRR  Trevor Carlson is here for cataract surgery.  Risks, benefits, limitations, and alternatives regarding cataract surgery have been reviewed with the patient.  Questions have been answered.  All parties agreeable.   Benay Pillow, MD  02/09/2021, 9:42 AM

## 2021-02-10 ENCOUNTER — Encounter: Payer: Self-pay | Admitting: Ophthalmology

## 2021-02-18 NOTE — Discharge Instructions (Signed)

## 2021-02-22 ENCOUNTER — Ambulatory Visit: Payer: Medicare Other | Admitting: Anesthesiology

## 2021-02-22 ENCOUNTER — Encounter
Admission: RE | Disposition: A | Discharge status: Home / Self Care | Payer: Self-pay | Source: Ambulatory Visit | Attending: Ophthalmology

## 2021-02-22 ENCOUNTER — Encounter: Payer: Self-pay | Admitting: Ophthalmology

## 2021-02-22 ENCOUNTER — Ambulatory Visit
Admission: RE | Admit: 2021-02-22 | Discharge: 2021-02-22 | Disposition: A | Discharge status: Home / Self Care | Payer: Medicare Other | Source: Ambulatory Visit | Attending: Ophthalmology | Admitting: Ophthalmology

## 2021-02-22 ENCOUNTER — Other Ambulatory Visit: Payer: Self-pay

## 2021-02-22 DIAGNOSIS — Z87891 Personal history of nicotine dependence: Secondary | ICD-10-CM | POA: Insufficient documentation

## 2021-02-22 DIAGNOSIS — Z8546 Personal history of malignant neoplasm of prostate: Secondary | ICD-10-CM | POA: Diagnosis not present

## 2021-02-22 DIAGNOSIS — H2512 Age-related nuclear cataract, left eye: Secondary | ICD-10-CM | POA: Insufficient documentation

## 2021-02-22 DIAGNOSIS — Z7982 Long term (current) use of aspirin: Secondary | ICD-10-CM | POA: Diagnosis not present

## 2021-02-22 DIAGNOSIS — Z7951 Long term (current) use of inhaled steroids: Secondary | ICD-10-CM | POA: Diagnosis not present

## 2021-02-22 DIAGNOSIS — Z79899 Other long term (current) drug therapy: Secondary | ICD-10-CM | POA: Diagnosis not present

## 2021-02-22 HISTORY — PX: CATARACT EXTRACTION W/PHACO: SHX586

## 2021-02-22 SURGERY — PHACOEMULSIFICATION, CATARACT, WITH IOL INSERTION
Anesthesia: Monitor Anesthesia Care | Site: Eye | Laterality: Left

## 2021-02-22 MED ORDER — ACETAMINOPHEN 10 MG/ML IV SOLN: 1000.0000 mg | Freq: Once | INTRAVENOUS | Status: DC | PRN

## 2021-02-22 MED ORDER — FENTANYL CITRATE (PF) 100 MCG/2ML IJ SOLN
INTRAMUSCULAR | Status: DC | PRN
  Administered 2021-02-22: 50 ug via INTRAVENOUS

## 2021-02-22 MED ORDER — LACTATED RINGERS IV SOLN: INTRAVENOUS | Status: DC

## 2021-02-22 MED ORDER — PHENYLEPHRINE HCL 10 % OP SOLN
1.0000 [drp] | OPHTHALMIC | Status: AC
  Administered 2021-02-22 (×3): 1 [drp] via OPHTHALMIC

## 2021-02-22 MED ORDER — MIDAZOLAM HCL 2 MG/2ML IJ SOLN
INTRAMUSCULAR | Status: DC | PRN
  Administered 2021-02-22: 1 mg via INTRAVENOUS

## 2021-02-22 MED ORDER — LIDOCAINE HCL (PF) 2 % IJ SOLN
INTRAOCULAR | Status: DC | PRN
  Administered 2021-02-22: 1 mL via INTRAOCULAR

## 2021-02-22 MED ORDER — TETRACAINE HCL 0.5 % OP SOLN
1.0000 [drp] | OPHTHALMIC | Status: DC | PRN
  Administered 2021-02-22 (×3): 1 [drp] via OPHTHALMIC

## 2021-02-22 MED ORDER — ONDANSETRON HCL 4 MG/2ML IJ SOLN: 4.0000 mg | Freq: Once | INTRAMUSCULAR | Status: DC | PRN

## 2021-02-22 MED ORDER — SIGHTPATH DOSE#1 SODIUM HYALURONATE 10 MG/ML IO SOLUTION
PREFILLED_SYRINGE | INTRAOCULAR | Status: DC | PRN
  Administered 2021-02-22: 0.55 mL via INTRAOCULAR

## 2021-02-22 MED ORDER — MOXIFLOXACIN HCL 0.5 % OP SOLN
OPHTHALMIC | Status: DC | PRN
  Administered 2021-02-22: 0.2 mL via OPHTHALMIC

## 2021-02-22 MED ORDER — SIGHTPATH DOSE#1 SODIUM HYALURONATE 23 MG/ML IO SOLUTION
PREFILLED_SYRINGE | INTRAOCULAR | Status: DC | PRN
  Administered 2021-02-22: .6 mL via INTRAOCULAR

## 2021-02-22 MED ORDER — CYCLOPENTOLATE HCL 2 % OP SOLN
1.0000 [drp] | OPHTHALMIC | Status: AC
  Administered 2021-02-22 (×3): 1 [drp] via OPHTHALMIC

## 2021-02-22 MED ORDER — SIGHTPATH DOSE#1 BSS IO SOLN
INTRAOCULAR | Status: DC | PRN
  Administered 2021-02-22: 15 mL

## 2021-02-22 SURGICAL SUPPLY — 14 items
CANNULA ANT/CHMB 27GA (MISCELLANEOUS) ×4 IMPLANT
DISSECTOR HYDRO NUCLEUS 50X22 (MISCELLANEOUS) ×2 IMPLANT
GLOVE SURG GAMMEX PI TX LF 7.5 (GLOVE) ×2 IMPLANT
GLOVE SURG SYN 8.5  E (GLOVE) ×2
GLOVE SURG SYN 8.5 E (GLOVE) ×1 IMPLANT
GOWN STRL REUS W/ TWL LRG LVL3 (GOWN DISPOSABLE) ×2 IMPLANT
GOWN STRL REUS W/TWL LRG LVL3 (GOWN DISPOSABLE) ×4
LENS IOL TECNIS EYHANCE 26.5 (Intraocular Lens) ×2 IMPLANT
MARKER SKIN DUAL TIP RULER LAB (MISCELLANEOUS) ×2 IMPLANT
PACK EYE AFTER SURG (MISCELLANEOUS) ×2 IMPLANT
SYR 3ML LL SCALE MARK (SYRINGE) ×2 IMPLANT
SYR TB 1ML LUER SLIP (SYRINGE) ×2 IMPLANT
WATER STERILE IRR 250ML POUR (IV SOLUTION) ×2 IMPLANT
WIPE NON LINTING 3.25X3.25 (MISCELLANEOUS) ×2 IMPLANT

## 2021-02-22 NOTE — H&P (Signed)
[place]   Primary Care Physician:  Tracie Harrier, MD Ophthalmologist: Dr. Benay Pillow  Pre-Procedure History & Physical: HPI:  Trevor Carlson is a 86 y.o. male here for cataract surgery.   Past Medical History:  Diagnosis Date   Actinic keratosis    Arthritis    osteoarthritis right knee   Basal cell carcinoma 06/2010   L nasal dorsum/med canthus, txted by Dr. Lacinda Axon, Mohs   History of hiatal hernia    Hypertension    Pre-syncope    Prostate cancer ([place])    Squamous cell carcinoma of skin 11/2012   SCC IS L ear concha   Squamous cell carcinoma of skin 05/25/2020   L post ear, EDC   Urinary incontinence    Wears partial dentures     Past Surgical History:  Procedure Laterality Date   CATARACT EXTRACTION W/PHACO Right 02/09/2021   Procedure: CATARACT EXTRACTION PHACO AND INTRAOCULAR LENS PLACEMENT ([place]) RIGHT 9.25 00:50.2;  Surgeon: Eulogio Bear, MD;  Location: [facility];  Service: Ophthalmology;  Laterality: Right;   COLONOSCOPY  2015   dental implants     ESOPHAGOGASTRODUODENOSCOPY (EGD) WITH PROPOFOL N/A 12/06/2016   Procedure: ESOPHAGOGASTRODUODENOSCOPY (EGD) WITH PROPOFOL;  Surgeon: Lollie Sails, MD;  Location: [facility] ENDOSCOPY;  Service: Endoscopy;  Laterality: N/A;   ESOPHAGOGASTRODUODENOSCOPY (EGD) WITH PROPOFOL N/A 01/10/2017   Procedure: ESOPHAGOGASTRODUODENOSCOPY (EGD) WITH PROPOFOL;  Surgeon: Lollie Sails, MD;  Location: [facility] ENDOSCOPY;  Service: Endoscopy;  Laterality: N/A;   PROSTATE SURGERY      Prior to Admission medications   Medication Sig Start Date End Date Taking? Authorizing Provider  aspirin EC 81 MG tablet Take 81 mg by mouth daily.   Yes [provider]  calcium citrate-vitamin D (CITRACAL+D) 315-200 MG-UNIT tablet Take 1 tablet by mouth 2 (two) times daily.   Yes [provider]  Coenzyme Q10 (CO Q-10) 100 MG CAPS Take 1 capsule by mouth daily.   Yes [provider]   glucosamine-chondroitin 500-400 MG tablet Take 1 tablet by mouth 2 (two) times daily.   Yes [provider]  losartan (COZAAR) 50 MG tablet Take 50 mg by mouth daily.   Yes [provider]  Multiple Vitamin (MULTIVITAMIN) tablet Take 1 tablet by mouth daily.   Yes [provider]  OMEGA-3 FATTY ACIDS PO Take 1,000 mg by mouth daily.   Yes [provider]  omeprazole (PRILOSEC) 40 MG capsule Take 40 mg by mouth daily.   Yes [provider]  Plant Sterols and Stanols (CHOLESTOFF PLUS PO) Take 2 tablets by mouth 2 (two) times daily.   Yes [provider]  polyethylene glycol (MIRALAX / GLYCOLAX) 17 g packet Take 17 g by mouth daily. 05/19/20  Yes Max Sane, MD  vitamin B-12 (CYANOCOBALAMIN) 1000 MCG tablet Take 1,000 mcg by mouth daily.   Yes [provider]  vitamin C (ASCORBIC ACID) 500 MG tablet Take 500 mg by mouth daily.   Yes [provider]  albuterol (PROVENTIL HFA;VENTOLIN HFA) 108 (90 Base) MCG/ACT inhaler Inhale 1-2 puffs into the lungs every 6 (six) hours as needed for wheezing or shortness of breath. Patient not taking: Reported on 02/02/2021    [provider]  naproxen sodium (ANAPROX) 220 MG tablet Take 220 mg by mouth 2 (two) times daily with a meal. Patient not taking: Reported on 02/02/2021    [provider]    Allergies as of 01/01/2021   (No Known Allergies)  History reviewed. No pertinent family history.  Social History   Socioeconomic History   Marital status: Widowed    Spouse name: Not on file   Number of children: Not on file   Years of education: Not on file   Highest education level: Not on file  Occupational History   Not on file  Tobacco Use   Smoking status: Former    Packs/day: 1.00    Years: 35.00    Pack years: 35.00    Types: Cigarettes    Quit date: 03/26/1974    Years since quitting: 46.9   Smokeless tobacco: Former    Quit date: 03/27/1979  Vaping Use    Vaping Use: Never used  Substance and Sexual Activity   Alcohol use: No   Drug use: Never   Sexual activity: Not on file  Other Topics Concern   Not on file  Social History Narrative   Not on file   Social Determinants of Health   Financial Resource Strain: Not on file  Food Insecurity: Not on file  Transportation Needs: Not on file  Physical Activity: Not on file  Stress: Not on file  Social Connections: Not on file  Intimate Partner Violence: Not on file    Review of Systems: See HPI, otherwise negative ROS  Physical Exam: BP (!) 165/83   Pulse 69   Temp (!) 97.2 F (36.2 C) (Temporal)   Ht 6' (1.829 m)   Wt 89.2 kg   SpO2 98%   BMI 26.68 kg/m  General:   Alert, cooperative in NAD Head:  Normocephalic and atraumatic. Respiratory:  Normal work of breathing. Cardiovascular:  RRR  Impression/Plan: Trevor Carlson is here for cataract surgery.  Risks, benefits, limitations, and alternatives regarding cataract surgery have been reviewed with the patient.  Questions have been answered.  All parties agreeable.   Benay Pillow, MD  02/22/2021, 8:03 AM

## 2021-02-22 NOTE — Op Note (Signed)
OPERATIVE NOTE  Daishon P [place] [id number] 02/22/2021   PREOPERATIVE DIAGNOSIS:  Nuclear sclerotic cataract left eye.  H25.12   POSTOPERATIVE DIAGNOSIS:    Nuclear sclerotic cataract left eye.     PROCEDURE:  Phacoemusification with posterior chamber intraocular lens placement of the left eye   LENS:   Implant Name Type Inv. Item Serial No. Manufacturer Lot No. LRB No. Used Action  LENS IOL TECNIS EYHANCE 26.5 - [id number] Intraocular Lens LENS IOL TECNIS EYHANCE 26.5 [id number]   Left 1 Implanted      Procedure(s): CATARACT EXTRACTION PHACO AND INTRAOCULAR LENS PLACEMENT (IOC) LEFT 7.42 00:44.1 (Left)  DIB00 +26.5   ULTRASOUND TIME: 0 minutes 44 seconds.  CDE 7.42   SURGEON:  Benay Pillow, MD, MPH   ANESTHESIA:  Topical with tetracaine drops augmented with 1% preservative-free intracameral lidocaine.  ESTIMATED BLOOD LOSS: <1 mL   COMPLICATIONS:  None.   DESCRIPTION OF PROCEDURE:  The patient was identified in the holding room and transported to the operating room and placed in the supine position under the operating microscope.  The left eye was identified as the operative eye and it was prepped and draped in the usual sterile ophthalmic fashion.   A 1.0 millimeter clear-corneal paracentesis was made at the 5:00 position. 0.5 ml of preservative-free 1% lidocaine with epinephrine was injected into the anterior chamber.  The anterior chamber was filled with Healon 5 viscoelastic.  A 2.4 millimeter keratome was used to make a near-clear corneal incision at the 2:00 position.  A curvilinear capsulorrhexis was made with a cystotome and capsulorrhexis forceps.  Balanced salt solution was used to hydrodissect and hydrodelineate the nucleus.   Phacoemulsification was then used in stop and chop fashion to remove the lens nucleus and epinucleus.  The remaining cortex was then removed using the irrigation and aspiration handpiece. Healon was then placed into the capsular bag to  distend it for lens placement.  A lens was then injected into the capsular bag.  The remaining viscoelastic was aspirated.   Wounds were hydrated with balanced salt solution.  The anterior chamber was inflated to a physiologic pressure with balanced salt solution.  Intracameral vigamox 0.1 mL undiltued was injected into the eye and a drop placed onto the ocular surface.  No wound leaks were noted.  The patient was taken to the recovery room in stable condition without complications of anesthesia or surgery  Benay Pillow 02/22/2021, 8:33 AM

## 2021-02-22 NOTE — Anesthesia Preprocedure Evaluation (Signed)
Anesthesia Evaluation  Patient identified by MRN, date of birth, ID band Patient awake    Reviewed: NPO status   History of Anesthesia Complications Negative for: history of anesthetic complications  Airway Mallampati: III  TM Distance: >3 FB Neck ROM: full    Dental  (+) Partial Lower   Pulmonary neg pulmonary ROS, former smoker,    Pulmonary exam normal breath sounds clear to auscultation       Cardiovascular Exercise Tolerance: Good hypertension, Normal cardiovascular exam Rhythm:Regular Rate:Normal     Neuro/Psych Numbness of left hand:    negative psych ROS   GI/Hepatic Neg liver ROS, hiatal hernia, GERD  Controlled,  Endo/Other  negative endocrine ROS  Renal/GU negative Renal ROS  negative genitourinary   Musculoskeletal  (+) Arthritis ,   Abdominal   Peds  Hematology Ca prostate- Hx of Prostatectomy 1998    Anesthesia Other Findings pcp: Azzie Glatter, MD at 10/14/2020 ;   Reproductive/Obstetrics                             Anesthesia Physical  Anesthesia Plan  ASA: 2  Anesthesia Plan: MAC   Post-op Pain Management:    Induction: Intravenous  PONV Risk Score and Plan: 1 and TIVA  Airway Management Planned: Nasal Cannula  Additional Equipment:   Intra-op Plan:   Post-operative Plan:   Informed Consent: I have reviewed the patients History and Physical, chart, labs and discussed the procedure including the risks, benefits and alternatives for the proposed anesthesia with the patient or authorized representative who has indicated his/her understanding and acceptance.       Plan Discussed with: CRNA  Anesthesia Plan Comments:         Anesthesia Quick Evaluation

## 2021-02-22 NOTE — Anesthesia Postprocedure Evaluation (Signed)
Anesthesia Post Note  Patient: Trevor Carlson  Procedure(s) Performed: CATARACT EXTRACTION PHACO AND INTRAOCULAR LENS PLACEMENT (IOC) LEFT 7.42 00:44.1 (Left: Eye)     Patient location during evaluation: PACU Anesthesia Type: MAC Level of consciousness: awake and alert Pain management: pain level controlled Vital Signs Assessment: post-procedure vital signs reviewed and stable Respiratory status: spontaneous breathing, nonlabored ventilation, respiratory function stable and patient connected to nasal cannula oxygen Cardiovascular status: stable and blood pressure returned to baseline Postop Assessment: no apparent nausea or vomiting Anesthetic complications: no   No notable events documented.  Jashira Cotugno A  Geremy Rister

## 2021-02-22 NOTE — Transfer of Care (Signed)
Immediate Anesthesia Transfer of Care Note  Patient: Trevor Carlson  Procedure(s) Performed: CATARACT EXTRACTION PHACO AND INTRAOCULAR LENS PLACEMENT (IOC) LEFT 7.42 00:44.1 (Left: Eye)  Patient Location: PACU  Anesthesia Type: MAC  Level of Consciousness: awake, alert  and patient cooperative  Airway and Oxygen Therapy: Patient Spontanous Breathing and Patient connected to supplemental oxygen  Post-op Assessment: Post-op Vital signs reviewed, Patient's Cardiovascular Status Stable, Respiratory Function Stable, Patent Airway and No signs of Nausea or vomiting  Post-op Vital Signs: Reviewed and stable  Complications: No notable events documented.

## 2021-02-23 ENCOUNTER — Encounter: Payer: Self-pay | Admitting: Ophthalmology

## 2021-03-22 ENCOUNTER — Ambulatory Visit: Payer: Medicare Other | Admitting: Dermatology

## 2021-05-18 ENCOUNTER — Ambulatory Visit: Payer: Medicare Other | Admitting: Dermatology

## 2021-06-30 ENCOUNTER — Ambulatory Visit: Payer: Medicare Other | Admitting: Dermatology

## 2021-06-30 ENCOUNTER — Other Ambulatory Visit: Payer: Self-pay

## 2021-06-30 DIAGNOSIS — L821 Other seborrheic keratosis: Secondary | ICD-10-CM

## 2021-06-30 DIAGNOSIS — Z872 Personal history of diseases of the skin and subcutaneous tissue: Secondary | ICD-10-CM

## 2021-06-30 DIAGNOSIS — D229 Melanocytic nevi, unspecified: Secondary | ICD-10-CM

## 2021-06-30 DIAGNOSIS — Z85828 Personal history of other malignant neoplasm of skin: Secondary | ICD-10-CM

## 2021-06-30 DIAGNOSIS — L814 Other melanin hyperpigmentation: Secondary | ICD-10-CM

## 2021-06-30 DIAGNOSIS — L570 Actinic keratosis: Secondary | ICD-10-CM | POA: Diagnosis not present

## 2021-06-30 DIAGNOSIS — L720 Epidermal cyst: Secondary | ICD-10-CM | POA: Diagnosis not present

## 2021-06-30 DIAGNOSIS — L820 Inflamed seborrheic keratosis: Secondary | ICD-10-CM | POA: Diagnosis not present

## 2021-06-30 DIAGNOSIS — L578 Other skin changes due to chronic exposure to nonionizing radiation: Secondary | ICD-10-CM

## 2021-06-30 DIAGNOSIS — D1801 Hemangioma of skin and subcutaneous tissue: Secondary | ICD-10-CM

## 2021-06-30 DIAGNOSIS — Z1283 Encounter for screening for malignant neoplasm of skin: Secondary | ICD-10-CM | POA: Diagnosis not present

## 2021-06-30 NOTE — Patient Instructions (Addendum)
Melanoma ABCDEs  Melanoma is the most dangerous type of skin cancer, and is the leading cause of death from skin disease.  You are more likely to develop melanoma if you: Have light-colored skin, light-colored eyes, or red or blond hair Spend a lot of time in the sun Tan regularly, either outdoors or in a tanning bed Have had blistering sunburns, especially during childhood Have a close family member who has had a melanoma Have atypical moles or large birthmarks  Early detection of melanoma is key since treatment is typically straightforward and cure rates are extremely high if we catch it early.   The first sign of melanoma is often a change in a mole or a new dark spot.  The ABCDE system is a way of remembering the signs of melanoma.  A for asymmetry:  The two halves do not match. B for border:  The edges of the growth are irregular. C for color:  A mixture of colors are present instead of an even brown color. D for diameter:  Melanomas are usually (but not always) greater than 74mm - the size of a pencil eraser. E for evolution:  The spot keeps changing in size, shape, and color.  Please check your skin once per month between visits. You can use a small mirror in front and a large mirror behind you to keep an eye on the back side or your body.   If you see any new or changing lesions before your next follow-up, please call to schedule a visit.  Please continue daily skin protection including broad spectrum sunscreen SPF 30+ to sun-exposed areas, reapplying every 2 hours as needed when you're outdoors.   Staying in the shade or wearing long sleeves, sun glasses (UVA+UVB protection) and wide brim hats (4-inch brim around the entire circumference of the hat) are also recommended for sun protection.   Cryotherapy Aftercare  Wash gently with soap and water everyday.   Apply Vaseline and Band-Aid daily until healed.   Prior to procedure, discussed risks of blister formation, small wound,  skin dyspigmentation, or rare scar following cryotherapy. Recommend Vaseline ointment to treated areas while healing.   If You Need Anything After Your Visit  If you have any questions or concerns for your doctor, please call our main line at [phone] and press option 4 to reach your doctor's medical assistant. If no one answers, please leave a voicemail as directed and we will return your call as soon as possible. Messages left after 4 pm will be answered the following business day.   You may also send [place] a message via [place]. We typically respond to MyChart messages within 1-2 business days.  For prescription refills, please ask your pharmacy to contact our office. Our fax number is [phone].  If you have an urgent issue when the clinic is closed that cannot wait until the next business day, you can page your doctor at the number below.    Please note that while we do our best to be available for urgent issues outside of office hours, we are not available 24/7.   If you have an urgent issue and are unable to reach [place], you may choose to seek medical care at your doctor's office, retail clinic, urgent care center, or emergency room.  If you have a medical emergency, please immediately call 911 or go to the emergency department.  Pager Numbers  - Dr. Nehemiah Massed: [phone]  - Dr. Laurence Ferrari: [phone]  - Dr. Nicole Kindred: [phone]  In the event of inclement weather, please call our main line at [phone] for an update on the status of any delays or closures.  Dermatology Medication Tips: Please keep the boxes that topical medications come in in order to help keep track of the instructions about where and how to use these. Pharmacies typically print the medication instructions only on the boxes and not directly on the medication tubes.   If your medication is too expensive, please contact our office at [phone] option 4 or send [place] a message through [place].   We are unable  to tell what your co-pay for medications will be in advance as this is different depending on your insurance coverage. However, we may be able to find a substitute medication at lower cost or fill out paperwork to get insurance to cover a needed medication.   If a prior authorization is required to get your medication covered by your insurance company, please allow [place] 1-2 business days to complete this process.  Drug prices often vary depending on where the prescription is filled and some pharmacies may offer cheaper prices.  The website www.goodrx.com contains coupons for medications through different pharmacies. The prices here do not account for what the cost may be with help from insurance (it may be cheaper with your insurance), but the website can give you the price if you did not use any insurance.  - You can print the associated coupon and take it with your prescription to the pharmacy.  - You may also stop by our office during regular business hours and pick up a GoodRx coupon card.  - If you need your prescription sent electronically to a different pharmacy, notify our office through [facility] or by phone at [phone] option 4.     Si Usted Necesita Algo Despus de Su Visita  Tambin puede enviarnos un mensaje a travs de Pharmacist, community. Por lo general respondemos a los mensajes de MyChart en el transcurso de 1 a 2 das hbiles.  Para renovar recetas, por favor pida a su farmacia que se ponga en contacto con nuestra oficina. Harland Dingwall de fax es [place] [phone].  Si tiene un asunto urgente cuando la clnica est cerrada y que no puede esperar hasta el siguiente da hbil, puede llamar/localizar a su doctor(a) al nmero que aparece a continuacin.   Por favor, tenga en cuenta que aunque hacemos todo lo posible para estar disponibles para asuntos urgentes fuera del horario de [place], no estamos disponibles las 24 horas del da, los 7 das de la [place].   Si tiene un  problema urgente y no puede comunicarse con nosotros, puede optar por buscar atencin mdica  en el consultorio de su doctor(a), en una clnica privada, en un centro de atencin urgente o en una sala de emergencias.  Si tiene Engineering geologist, por favor llame inmediatamente al 911 o vaya a la sala de emergencias.  Nmeros de bper  - Dr. Nehemiah Massed: [phone]  - Dra. Moye: [phone]  - Dra. Nicole Kindred: [phone]  En caso de inclemencias del [place], por favor llame a Johnsie Kindred principal al [phone] para una actualizacin sobre el [place] de cualquier retraso o cierre.  Consejos para la medicacin en dermatologa: Por favor, guarde las cajas en las que vienen los medicamentos de uso tpico para ayudarle a seguir las instrucciones sobre dnde y cmo usarlos. Las farmacias generalmente imprimen las instrucciones del medicamento slo en las cajas y no directamente en los tubos del [place].  Si su medicamento es muy caro, por favor, pngase en contacto con Zigmund Daniel llamando al [phone] y presione la opcin 4 o envenos un mensaje a travs de Pharmacist, community.   No podemos decirle cul ser su copago por los medicamentos por adelantado ya que esto es diferente dependiendo de la cobertura de su seguro. Sin embargo, es posible que podamos encontrar un medicamento sustituto a Electrical engineer un formulario para que el seguro cubra el medicamento que se considera necesario.   Si se requiere una autorizacin previa para que su compaa de seguros [place] su medicamento, por favor permtanos de 1 a 2 das hbiles para completar este proceso.  Los precios de los medicamentos varan con frecuencia dependiendo del Environmental consultant de dnde se surte la receta y alguna farmacias pueden ofrecer precios ms baratos.  El sitio web www.goodrx.com tiene cupones para medicamentos de Airline pilot. Los precios aqu no tienen en cuenta lo que podra costar con la ayuda del seguro (puede ser ms  barato con su seguro), pero el sitio web puede darle el precio si no utiliz Research scientist (physical sciences).  - Puede imprimir el cupn correspondiente y llevarlo con su receta a la farmacia.  - Tambin puede pasar por nuestra oficina durante el horario de atencin regular y Charity fundraiser una tarjeta de cupones de GoodRx.  - Si necesita que su receta se enve electrnicamente a una farmacia diferente, informe a nuestra oficina a travs de MyChart de [place] o por telfono llamando al [phone] y presione la opcin 4.

## 2021-06-30 NOTE — Progress Notes (Signed)
Follow-Up Visit   Subjective  Trevor Carlson is a 87 y.o. male who presents for the following: Annual Exam (Here for waist up skin check and sun exposed check. HxAK's. HxSCC's. C/O spots on ears and chest.). Some are itchy.  Spot on L ear previously frozen but has come back.  The patient presents for Upper Body Skin Exam (UBSE) for skin cancer screening and mole check.  The patient has spots, moles and lesions to be evaluated, some may be new or changing and the patient has concerns that these could be cancer.   The following portions of the chart were reviewed this encounter and updated as appropriate:      Review of Systems: No other skin or systemic complaints except as noted in HPI or Assessment and Plan.   Objective  Well appearing patient in no apparent distress; mood and affect are within normal limits.  All skin waist up examined.  left superior helix x1, left forearm x1, left neck x1, right chest x1 (4) 72mm waxy flesh papule with crusting at left antihelix. Erythematous keratotic or waxy stuck-on papules   Right Superior Helix x2, left nasal dorsum x1, right lateral cheek x2, right infraocular x1, right nasal dorsum x1, left forearm x3 (10) Erythematous thin papules/macules with gritty scale.   Right Upper Back 1.3 cm subcutaneous nodule.    Assessment & Plan  Inflamed seborrheic keratosis (4) left superior helix x1, left forearm x1, left neck x1, right chest x1  Recheck L antihelix of f/up.  If not resolved at left upper antihelix will Bx at next visit.   Destruction of lesion - left superior helix x1, left forearm x1, left neck x1, right chest x1  Destruction method: cryotherapy   Informed consent: discussed and consent obtained   Lesion destroyed using liquid nitrogen: Yes   Region frozen until ice ball extended beyond lesion: Yes   Outcome: patient tolerated procedure well with no complications   Post-procedure details: wound care instructions given    Additional details:  Prior to procedure, discussed risks of blister formation, small wound, skin dyspigmentation, or rare scar following cryotherapy. Recommend Vaseline ointment to treated areas while healing.   AK (actinic keratosis) (10) Right Superior Helix x2, left nasal dorsum x1, right lateral cheek x2, right infraocular x1, right nasal dorsum x1, left forearm x3  Actinic keratoses are precancerous spots that appear secondary to cumulative UV radiation exposure/sun exposure over time. They are chronic with expected duration over 1 year. A portion of actinic keratoses will progress to squamous cell carcinoma of the skin. It is not possible to reliably predict which spots will progress to skin cancer and so treatment is recommended to prevent development of skin cancer.  Recommend daily broad spectrum sunscreen SPF 30+ to sun-exposed areas, reapply every 2 hours as needed.  Recommend staying in the shade or wearing long sleeves, sun glasses (UVA+UVB protection) and wide brim hats (4-inch brim around the entire circumference of the hat). Call for new or changing lesions.  Destruction of lesion - Right Superior Helix x2, left nasal dorsum x1, right lateral cheek x2, right infraocular x1, right nasal dorsum x1, left forearm x3  Destruction method: cryotherapy   Informed consent: discussed and consent obtained   Lesion destroyed using liquid nitrogen: Yes   Region frozen until ice ball extended beyond lesion: Yes   Outcome: patient tolerated procedure well with no complications   Post-procedure details: wound care instructions given   Additional details:  Prior to procedure,  discussed risks of blister formation, small wound, skin dyspigmentation, or rare scar following cryotherapy. Recommend Vaseline ointment to treated areas while healing.   Epidermal inclusion cyst Right Upper Back  Benign-appearing. Exam most consistent with an epidermal inclusion cyst. Discussed that a cyst is a benign  growth that can grow over time and sometimes get irritated or inflamed. Recommend observation if it is not bothersome. Discussed option of surgical excision to remove it if it is growing, symptomatic, or other changes noted. Please call for new or changing lesions so they can be evaluated.     Lentigines - Scattered tan macules - Due to sun exposure - Benign-appearing, observe - Recommend daily broad spectrum sunscreen SPF 30+ to sun-exposed areas, reapply every 2 hours as needed. - Call for any changes  Seborrheic Keratoses - Stuck-on, waxy, tan-brown papules and/or plaques  - Benign-appearing - Discussed benign etiology and prognosis. - Observe - Call for any changes  Melanocytic Nevi - Tan-brown and/or pink-flesh-colored symmetric macules and papules - Benign appearing on exam today - Observation - Call clinic for new or changing moles - Recommend daily use of broad spectrum spf 30+ sunscreen to sun-exposed areas.   Hemangiomas - Red papules - Discussed benign nature - Observe - Call for any changes  Actinic Damage - Chronic condition, secondary to cumulative UV/sun exposure - diffuse scaly erythematous macules with underlying dyspigmentation - Recommend daily broad spectrum sunscreen SPF 30+ to sun-exposed areas, reapply every 2 hours as needed.  - Staying in the shade or wearing long sleeves, sun glasses (UVA+UVB protection) and wide brim hats (4-inch brim around the entire circumference of the hat) are also recommended for sun protection.  - Call for new or changing lesions.  Skin cancer screening performed today.  History of Squamous Cell Carcinoma in Situ of the Skin - No evidence of recurrence today left ear concha (2014) - Recommend regular full body skin exams - Recommend daily broad spectrum sunscreen SPF 30+ to sun-exposed areas, reapply every 2 hours as needed.  - Call if any new or changing lesions are noted between office visits   History of Squamous  Cell Carcinoma of the Skin - No evidence of recurrence today on left posterior ear (2021) - Recommend regular full body skin exams - Recommend daily broad spectrum sunscreen SPF 30+ to sun-exposed areas, reapply every 2 hours as needed.  - Call if any new or changing lesions are noted between office visits    Return for AK follow-up in 6 months, recheck L ear.  I, Emelia Salisbury, CMA, am acting as scribe for [name], MD.  Documentation: I have reviewed the above documentation for accuracy and completeness, and I agree with the above.  [name] MD

## 2021-09-24 IMAGING — CT CT CTA ABD/PEL W/CM AND/OR W/O CM
3 of 10 series · 11 of 46 positions shown, 17 images · IV contrast (omnipaque)
Comparison: None.

CLINICAL DATA: Melena rectal bleeding

EXAM:
CTA ABDOMEN AND PELVIS WITHOUT AND WITH CONTRAST
TECHNIQUE: Multidetector CT imaging of the abdomen and pelvis was performed
using the standard protocol during bolus administration of
intravenous contrast. Multiplanar reconstructed images and MIPs were
obtained and reviewed to evaluate the vascular anatomy.
CONTRAST:  100mL OMNIPAQUE IOHEXOL 350 MG/ML SOLN

[Series 5: axial arterial · axial · arterial · 0.84mm/px · z∈[-307,-59]mm · 5 of 249 slices shown]
[im 18/249  soft-tissue]
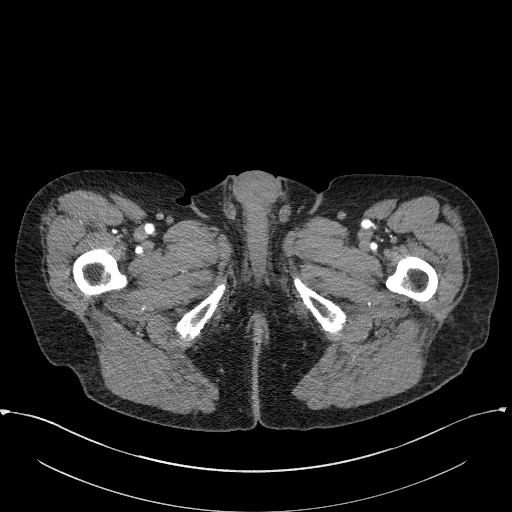
[im 54/249  soft-tissue]
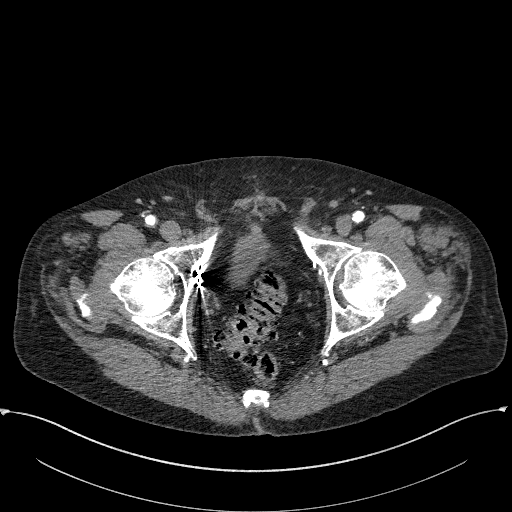
[im 89/249  soft-tissue]
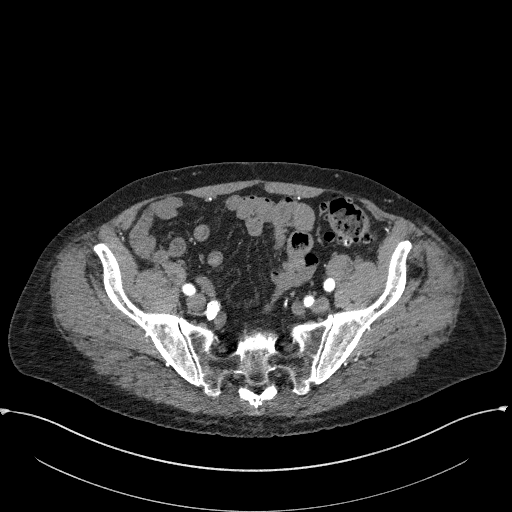
[im 107/249  soft-tissue]
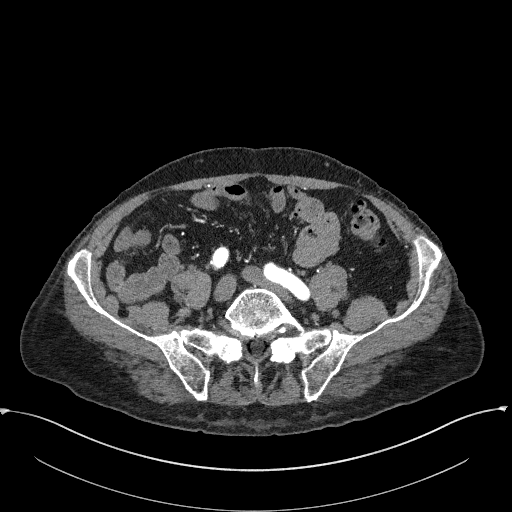
[im 142/249  soft-tissue]
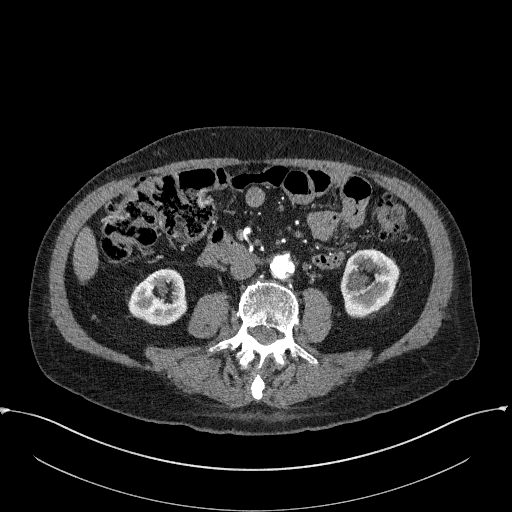

[Series 6: axial venous · axial · portal-venous · 0.84mm/px · z∈[-245,+55]mm · 4 of 100 slices shown, 9 images]
[im 20/100  soft-tissue]
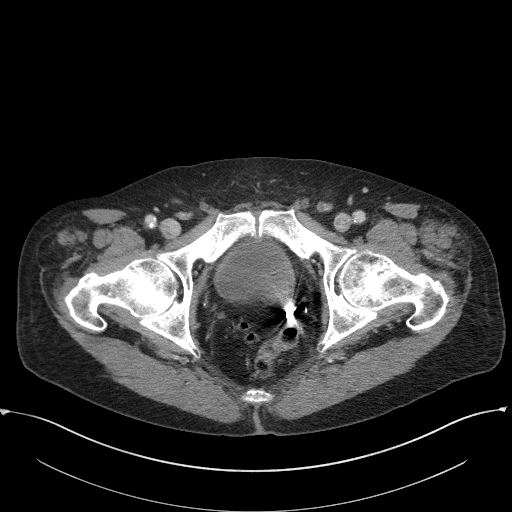
[im 20/100  lung]
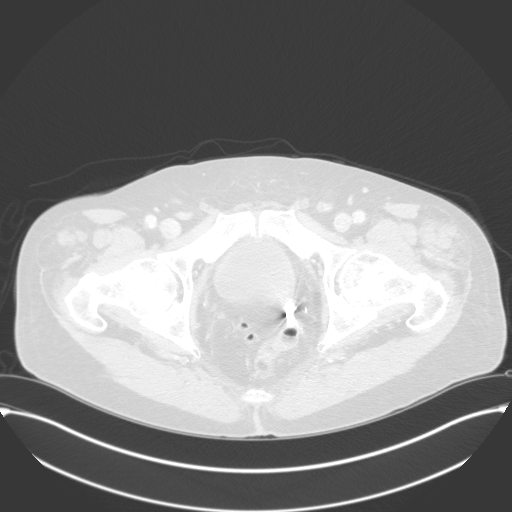
[im 20/100  bone]
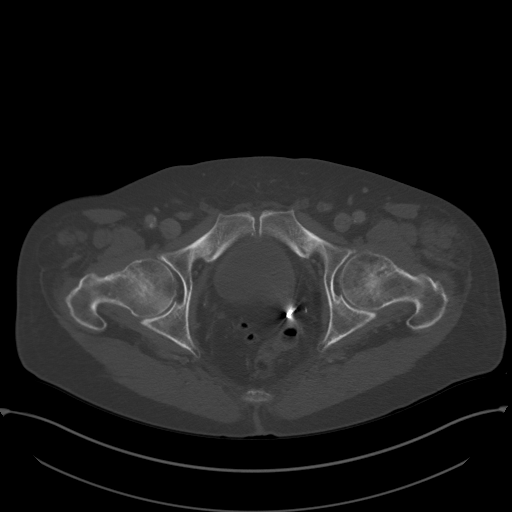
[im 40/100  soft-tissue]
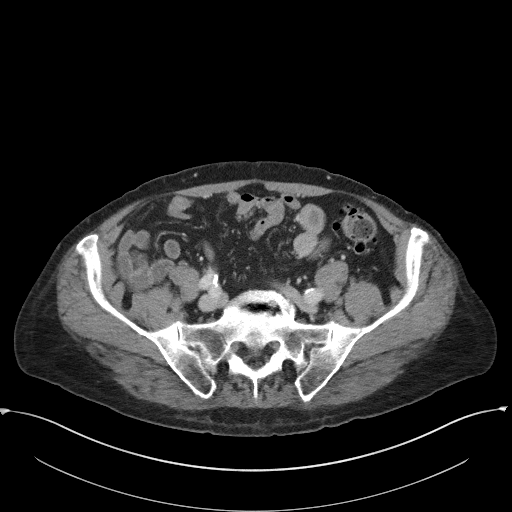
[im 40/100  lung]
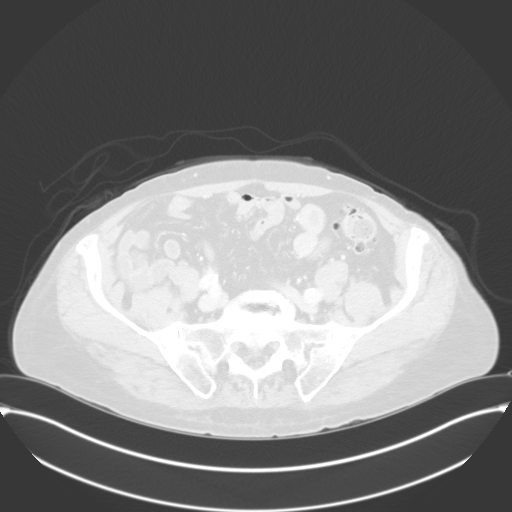
[im 60/100  soft-tissue]
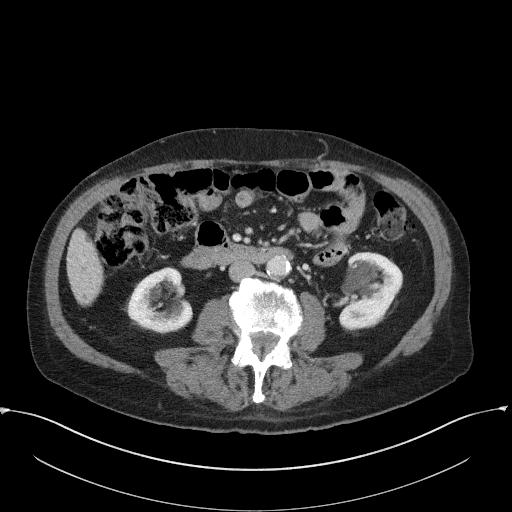
[im 60/100  lung]
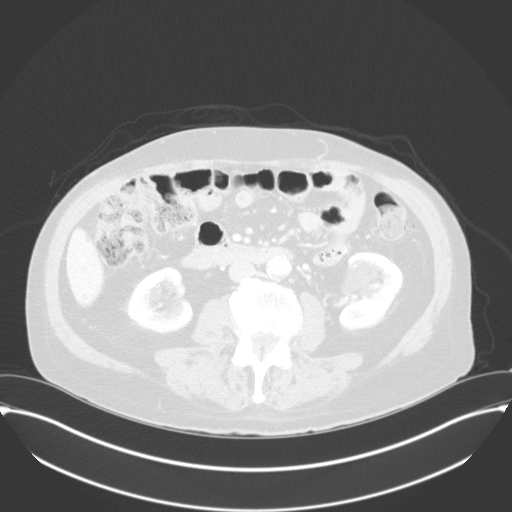
[im 80/100  soft-tissue]
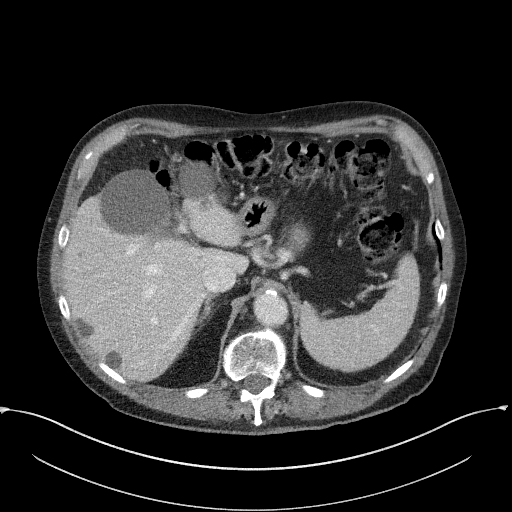
[im 80/100  lung]
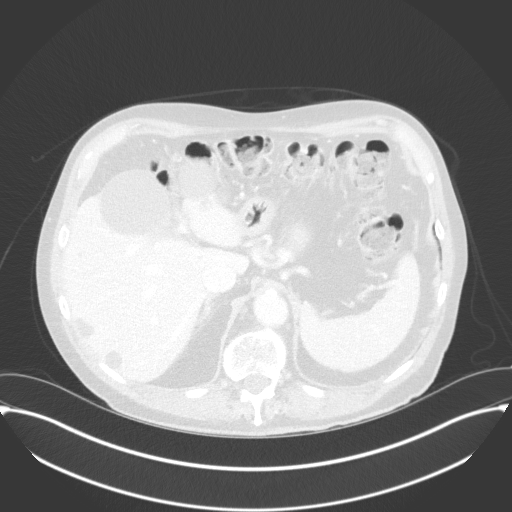

[Series 9: coronal mpr · coronal · 0.77mm/px · 2 of 138 slices shown, 3 images]
[im 46/138  soft-tissue]
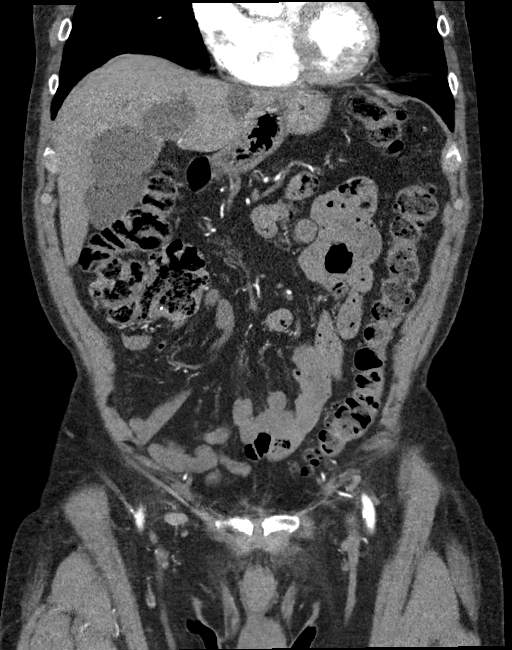
[im 46/138  bone]
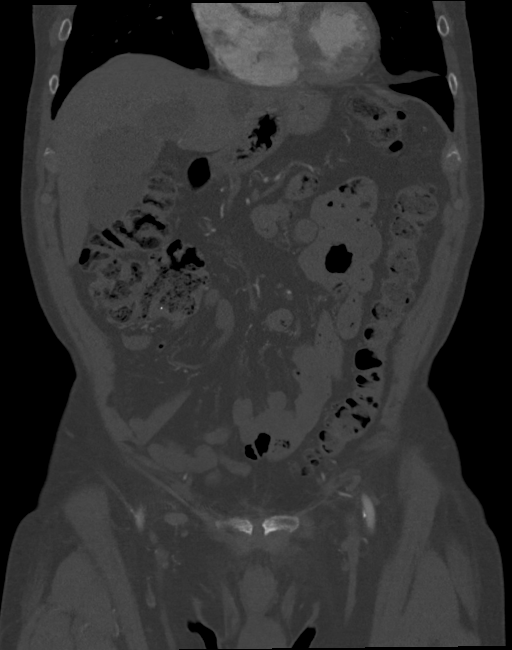
[im 92/138  soft-tissue]
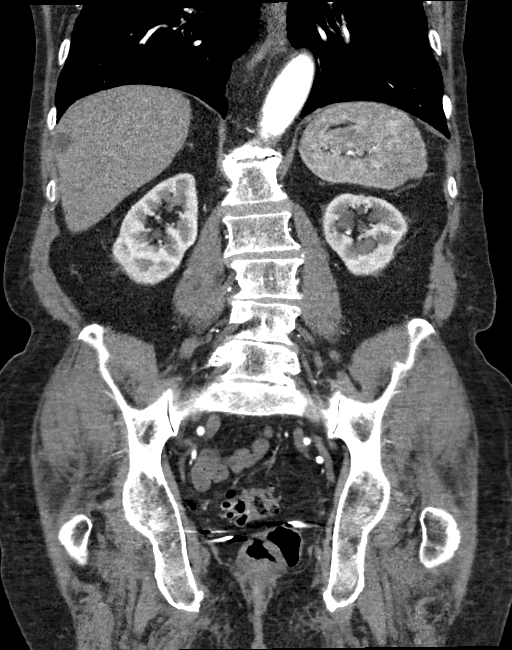

[11 of 46 positions shown; findings below may reference images not displayed]

FINDINGS: CTA ABDOMEN AND PELVIS FINDINGS

VASCULAR

Aorta: Normal caliber aorta without aneurysm, dissection, vasculitis
or hemodynamically significant stenosis. There is moderate to
advanced and noncalcified aortic atherosclerosis.

Celiac: There is mild stenosis seen at the origin of the celiac due
to atherosclerosis.

SMA: There is mild stenosis at the origin of the SMA due to
atherosclerosis.

Renals: Single renal arteries bilaterally. No aneurysm, dissection,
stenosis or evidence of fibromuscular dysplasia. Mild
atherosclerosis in the renal arteries.

IMA: Patent without abnormality.

Inflow: Moderate atherosclerosis seen in the iliacs.

Veins: Normal course and caliber of the major veins. Assessment is
otherwise limited by the arterial dominant contrast phase.

Review of the MIP images confirms the above findings.

NON-VASCULAR

Hepatobiliary: Multiple low-density lesions are seen throughout the
liver parenchyma. The largest within the anterior right liver lobe
measuring cm. No visible biliary dilatation. Normal gallbladder.

Pancreas: Normal contours without ductal dilatation. No
peripancreatic fluid collection.

Spleen: Normal arterial phase splenic enhancement pattern.

Adrenals/Urinary Tract:

--Adrenal glands: Normal.

Kidneys: Multiple bilateral low-density lesions are seen throughout
both kidneys with probable left-sided parapelvic cysts. No renal or
collecting system calculi. No hydronephrosis.--Urinary bladder:
Unremarkable.

Stomach/Bowel:

--Stomach/Duodenum: No hiatal hernia or other gastric abnormality.
Normal duodenal course and caliber.

--Small bowel: No dilatation or inflammation.

--Colon: There is a large amount of stool seen throughout with
diverticulosis. There appears to be wall thickening seen around the
rectum with perirectal edema.

Lymphatic:  No abdominal or pelvic lymphadenopathy.

Reproductive: No free fluid in the pelvis.

Musculoskeletal. No bony spinal canal stenosis or focal osseous
abnormality. A chronic compression deformity of the T12 vertebral
body.

Other: None.

Review of the MIP images confirms the above findings.
IMPRESSION: VASCULAR

Moderate to advanced aortic atherosclerosis. No acute aortic
abnormality.

Mild stenosis at the origin of the SMA and celiac axis due to
atherosclerosis.

NON-VASCULAR

Findings which could be suggestive of mild proctocolitis.

Moderate to large amount of colonic stool present throughout.

Multiple hepatic and renal cysts.

## 2021-10-31 ENCOUNTER — Emergency Department: Payer: Medicare Other

## 2021-10-31 ENCOUNTER — Emergency Department
Admission: EM | Admit: 2021-10-31 | Discharge: 2021-11-01 | Disposition: A | Discharge status: Home / Self Care | Payer: Medicare Other | Attending: Emergency Medicine | Admitting: Emergency Medicine

## 2021-10-31 ENCOUNTER — Other Ambulatory Visit: Payer: Self-pay

## 2021-10-31 DIAGNOSIS — K529 Noninfective gastroenteritis and colitis, unspecified: Secondary | ICD-10-CM | POA: Diagnosis not present

## 2021-10-31 DIAGNOSIS — R0981 Nasal congestion: Secondary | ICD-10-CM | POA: Diagnosis not present

## 2021-10-31 DIAGNOSIS — Z8546 Personal history of malignant neoplasm of prostate: Secondary | ICD-10-CM | POA: Insufficient documentation

## 2021-10-31 DIAGNOSIS — Z20822 Contact with and (suspected) exposure to covid-19: Secondary | ICD-10-CM | POA: Insufficient documentation

## 2021-10-31 DIAGNOSIS — I10 Essential (primary) hypertension: Secondary | ICD-10-CM | POA: Diagnosis not present

## 2021-10-31 DIAGNOSIS — Z85828 Personal history of other malignant neoplasm of skin: Secondary | ICD-10-CM | POA: Diagnosis not present

## 2021-10-31 DIAGNOSIS — R059 Cough, unspecified: Secondary | ICD-10-CM | POA: Insufficient documentation

## 2021-10-31 DIAGNOSIS — R112 Nausea with vomiting, unspecified: Secondary | ICD-10-CM

## 2021-10-31 LAB — COMPREHENSIVE METABOLIC PANEL
ALT: 13 U/L (ref 0–44)
AST: 21 U/L (ref 15–41)
Albumin: 4.1 g/dL (ref 3.5–5.0)
Alkaline Phosphatase: 62 U/L (ref 38–126)
Anion gap: 6 (ref 5–15)
BUN: 22 mg/dL (ref 8–23)
CO2: 24 mmol/L (ref 22–32)
Calcium: 9 mg/dL (ref 8.9–10.3)
Chloride: 108 mmol/L (ref 98–111)
Creatinine, Ser: 1.05 mg/dL (ref 0.61–1.24)
GFR, Estimated: >60 mL/min (ref >60)
Glucose, Bld: 126 mg/dL — ABNORMAL HIGH (ref 70–99)
Potassium: 4.1 mmol/L (ref 3.5–5.1)
Sodium: 138 mmol/L (ref 135–145)
Total Bilirubin: 1.1 mg/dL (ref 0.3–1.2)
Total Protein: 7.4 g/dL (ref 6.5–8.1)

## 2021-10-31 LAB — CBC WITH DIFFERENTIAL/PLATELET
Abs Immature Granulocytes: 0.02 10*3/uL (ref 0.00–0.07)
Basophils Absolute: 0.1 10*3/uL (ref 0.0–0.1)
Basophils Relative: 1 %
Eosinophils Absolute: 0.2 10*3/uL (ref 0.0–0.5)
Eosinophils Relative: 3 %
HCT: 47 % (ref 39.0–52.0)
Hemoglobin: 15.5 g/dL (ref 13.0–17.0)
Immature Granulocytes: 0 %
Lymphocytes Relative: 5 %
Lymphs Abs: 0.4 10*3/uL — ABNORMAL LOW (ref 0.7–4.0)
MCH: 29.6 pg (ref 26.0–34.0)
MCHC: 33 g/dL (ref 30.0–36.0)
MCV: 89.9 fL (ref 80.0–100.0)
Monocytes Absolute: 0.6 10*3/uL (ref 0.1–1.0)
Monocytes Relative: 8 %
Neutro Abs: 5.9 10*3/uL (ref 1.7–7.7)
Neutrophils Relative %: 83 %
Platelets: 174 10*3/uL (ref 150–400)
RBC: 5.23 MIL/uL (ref 4.22–5.81)
RDW: 14.9 % (ref 11.5–15.5)
WBC: 7.2 10*3/uL (ref 4.0–10.5)
nRBC: 0 % (ref 0.0–0.2)

## 2021-10-31 LAB — LIPASE, BLOOD: Lipase: 38 U/L (ref 11–51)

## 2021-10-31 MED ORDER — ONDANSETRON HCL 4 MG/2ML IJ SOLN
4.0000 mg | Freq: Once | INTRAMUSCULAR | Status: AC
Start: 2021-10-31 — End: 2021-10-31
  Administered 2021-10-31: 4 mg via INTRAVENOUS
  Filled 2021-10-31: qty 2

## 2021-10-31 MED ORDER — LACTATED RINGERS IV BOLUS
1000.0000 mL | Freq: Once | INTRAVENOUS | Status: AC
  Administered 2021-10-31: 1000 mL via INTRAVENOUS

## 2021-10-31 NOTE — ED Provider Notes (Signed)
? ?[facility] ?Provider Note ? ? ? Event Date/Time  ? First MD Initiated Contact with Patient 10/31/21 2259   ?  (approximate) ? ? ?History  ? ?Emesis (Patient reports sudden onset vomiting and diarrhea that started around 20:00 tonight; Says that his symptoms started after he had a roast beef sandwich for dinner) ? ? ?HPI ? ?Trevor Carlson is a 87 y.o. male with a history of prostate cancer not undergoing treatment, hypertension, hiatal hernia who presents for evaluation of vomiting and diarrhea.  Patient reports that his symptoms started suddenly around 8 PM after eating a roast beef sandwich for dinner.  Patient reports approximately 10 episodes of watery diarrhea and 8 episodes of nonbloody nonbilious emesis.  No hematemesis, no melena, no hematochezia, no coffee-ground emesis.  He denies abdominal pain.  No one else ate the same sandwich.  He denies chest pain or shortness of breath.  Patient does report cough and congestion that he has had for the last 3 weeks.  Has been on 2 rounds of antibiotics and prednisone for bronchitis.  Reports that his symptoms have improved but still has a mild cough.  No fever.  No prior history of C. difficile although patient was recently on a Z-Pak and Levaquin ?  ? ? ?Past Medical History:  ?Diagnosis Date  ? Actinic keratosis   ? Arthritis   ? osteoarthritis right knee  ? Basal cell carcinoma 06/2010  ? L nasal dorsum/med canthus, txted by Dr. Lacinda Axon, Mohs  ? History of hiatal hernia   ? Hypertension   ? Pre-syncope   ? Prostate cancer [place])   ? Squamous cell carcinoma of skin 11/2012  ? SCC IS L ear concha  ? Squamous cell carcinoma of skin 05/25/2020  ? L post ear, [facility]  ? Urinary incontinence   ? Wears partial dentures   ? ? ?Past Surgical History:  ?Procedure Laterality Date  ? CATARACT EXTRACTION W/PHACO Right 02/09/2021  ? Procedure: CATARACT EXTRACTION PHACO AND INTRAOCULAR LENS PLACEMENT (IOC) RIGHT 9.25 00:50.2;  Surgeon: Eulogio Bear, MD;   Location: [place];  Service: Ophthalmology;  Laterality: Right;  ? CATARACT EXTRACTION W/PHACO Left 02/22/2021  ? Procedure: CATARACT EXTRACTION PHACO AND INTRAOCULAR LENS PLACEMENT (IOC) LEFT 7.42 00:44.1;  Surgeon: Eulogio Bear, MD;  Location: [place];  Service: Ophthalmology;  Laterality: Left;  ? COLONOSCOPY  2015  ? dental implants    ? ESOPHAGOGASTRODUODENOSCOPY (EGD) WITH PROPOFOL N/A 12/06/2016  ? Procedure: ESOPHAGOGASTRODUODENOSCOPY (EGD) WITH PROPOFOL;  Surgeon: Lollie Sails, MD;  Location: [facility] ENDOSCOPY;  Service: Endoscopy;  Laterality: N/A;  ? ESOPHAGOGASTRODUODENOSCOPY (EGD) WITH PROPOFOL N/A 01/10/2017  ? Procedure: ESOPHAGOGASTRODUODENOSCOPY (EGD) WITH PROPOFOL;  Surgeon: Lollie Sails, MD;  Location: [facility] ENDOSCOPY;  Service: Endoscopy;  Laterality: N/A;  ? PROSTATE SURGERY    ? ? ? ?Physical Exam  ? ?Triage Vital Signs: ?ED Triage Vitals  ?Enc Vitals Group  ?   BP 10/31/21 2249 (!) 157/83  ?   Pulse Rate 10/31/21 2249 (!) 115  ?   Resp 10/31/21 2249 20  ?   Temp 10/31/21 2249 98.4 ?F (36.9 ?C)  ?   Temp Source 10/31/21 2249 Oral  ?   SpO2 10/31/21 2249 95 %  ?   Weight 10/31/21 2250 196 lb (88.9 kg)  ?   Height 10/31/21 2250 6' (1.829 m)  ?   Head Circumference --   ?   Peak Flow --   ?  Pain Score 10/31/21 2250 0  ?   Pain Loc --   ?   Pain Edu? --   ?   Excl. in [place]? --   ? ? ?Most recent vital signs: ?Vitals:  ? 11/01/21 0000 11/01/21 0100  ?BP: 116/73 124/73  ?Pulse: 100 (!) 101  ?Resp: (!) 21 (!) 21  ?Temp:    ?SpO2: 96% 94%  ? ? ? ?Constitutional: Alert and oriented. Well appearing and in no apparent distress. ?HEENT: ?     Head: Normocephalic and atraumatic.    ?     Eyes: Conjunctivae are normal. Sclera is non-icteric.  ?     Mouth/Throat: Mucous membranes are moist.  ?     Neck: Supple with no signs of meningismus. ?Cardiovascular: Regular rate and rhythm. No murmurs, gallops, or rubs. 2+ symmetrical distal pulses are present in all extremities.   ?Respiratory: Normal respiratory effort. Lungs are clear to auscultation bilaterally.  ?Gastrointestinal: Soft, non tender, and non distended with positive bowel sounds. No rebound or guarding. ?Genitourinary: No CVA tenderness. ?Musculoskeletal:  No edema, cyanosis, or erythema of extremities. ?Neurologic: Normal speech and language. Face is symmetric. Moving all extremities. No gross focal neurologic deficits are appreciated. ?Skin: Skin is warm, dry and intact. No rash noted. ?Psychiatric: Mood and affect are normal. Speech and behavior are normal. ? ?ED Results / Procedures / Treatments  ? ?Labs ?(all labs ordered are listed, but only abnormal results are displayed) ?Labs Reviewed  ?COMPREHENSIVE METABOLIC PANEL - Abnormal; Notable for the following components:  ?    Result Value  ? Glucose, Bld 126 (*)   ? All other components within normal limits  ?CBC WITH DIFFERENTIAL/PLATELET - Abnormal; Notable for the following components:  ? Lymphs Abs 0.4 (*)   ? All other components within normal limits  ?RESP PANEL BY RT-PCR (FLU A&B, COVID) ARPGX2  ?C DIFFICILE QUICK SCREEN W PCR REFLEX    ?LIPASE, BLOOD  ?URINALYSIS, ROUTINE W REFLEX MICROSCOPIC  ? ? ? ?EKG ? ?ED ECG REPORT ?I, Rudene Re, the attending physician, personally viewed and interpreted this ECG. ? ?Sinus tachycardia at a rate of 112, normal intervals, normal axis, no ST elevations or depression ? ?RADIOLOGY ?I, Rudene Re, attending MD, have personally viewed and interpreted the images obtained during this visit as below: ? ?Chest x-ray with no signs of pneumonia ? ? ?___________________________________________________ ?Interpretation by Radiologist:  ?DG Chest Portable 1 View ? ?Result Date: 10/31/2021 ?CLINICAL DATA:  Cough. EXAM: PORTABLE CHEST 1 VIEW COMPARISON:  Chest radiograph dated 09/15/2016. FINDINGS: Minimal left lung base atelectasis. No focal consolidation, pleural effusion, or pneumothorax. The cardiac silhouette is within  limits. Atherosclerotic calcification of the aorta. Small hiatal hernia. No acute osseous pathology. IMPRESSION: No active disease. Electronically Signed   By: Anner Crete M.D.   On: 10/31/2021 23:33   ? ? ? ? ?PROCEDURES: ? ?Critical Care performed: No ? ?Procedures ? ? ? ?IMPRESSION / MDM / ASSESSMENT AND PLAN / ED COURSE  ?I reviewed the triage vital signs and the nursing notes. ? ?87 y.o. male with a history of prostate cancer not undergoing treatment, hypertension, hiatal hernia who presents for evaluation of several episodes of NBNB emesis and watery diarrhea since 8PM. No abdominal pain. Also a mild cough x 3 weeks, s/p 2 rounds of abx and prednisone for bronchitis. No fever, CP, SOB, abd pain.  Patient is well-appearing in no distress, slightly tachycardic but normotensive and afebrile.  Abdomen is soft and nontender.  Lungs are clear to auscultation ? ?Ddx: Food poisoning versus viral gastroenteritis versus diverticulitis versus colitis versus enteritis versus C. difficile colitis in the setting of recent antibiotic use ? ? ?Plan: CBC, CMP, lipase, EKG, chest x-ray, COVID and flu.  IV fluids, IV Zofran ? ? ?MEDICATIONS GIVEN IN ED: ?Medications  ?ondansetron (ZOFRAN) injection 4 mg (4 mg Intravenous Given 10/31/21 2321)  ?lactated ringers bolus 1,000 mL (0 mLs Intravenous Stopped 11/01/21 0120)  ? ? ? ?ED COURSE: Labs showing no leukocytosis, no anemia, no electrolyte derangements, no AKI, LFTs and lipase are within normal limits.  COVID and flu negative.  Chest x-ray with no signs of pneumonia.  After IV fluids and Zofran patient is tolerating p.o. with no further episodes of vomiting.  Patient has not had a bowel movement in 3.5 hours that he has been in the emergency room therefore less likely C. difficile.  Recommended increase oral hydration, bland diet, Zofran as needed for the next 48 hours and close follow-up with primary care doctor.  Serial abdominal exams remain with no tenderness or distention.   Discussed my standard return precautions for any recurrence of the vomiting, dehydration, or abdominal pain ? ? ?Consults: None ? ? ?EMR reviewed including records from his last visit with his PCP from April 20

## 2021-11-01 LAB — RESP PANEL BY RT-PCR (FLU A&B, COVID) ARPGX2
Influenza A by PCR: NEGATIVE
Influenza B by PCR: NEGATIVE
SARS Coronavirus 2 by RT PCR: NEGATIVE

## 2021-11-01 MED ORDER — ONDANSETRON 4 MG PO TBDP: 4.0000 mg | ORAL_TABLET | Freq: Three times a day (TID) | ORAL | 0 refills | Status: DC | PRN

## 2021-11-01 NOTE — ED Notes (Signed)
Pt given water and saltine crackers for PO challenge.  ?Pt given warm blanket as well ?

## 2021-11-09 ENCOUNTER — Ambulatory Visit: Payer: Medicare Other | Admitting: Dermatology

## 2022-01-03 ENCOUNTER — Ambulatory Visit: Payer: Medicare Other | Admitting: Dermatology

## 2022-01-03 DIAGNOSIS — L820 Inflamed seborrheic keratosis: Secondary | ICD-10-CM

## 2022-01-03 DIAGNOSIS — L578 Other skin changes due to chronic exposure to nonionizing radiation: Secondary | ICD-10-CM | POA: Diagnosis not present

## 2022-01-03 DIAGNOSIS — L570 Actinic keratosis: Secondary | ICD-10-CM | POA: Diagnosis not present

## 2022-01-03 DIAGNOSIS — L720 Epidermal cyst: Secondary | ICD-10-CM

## 2022-01-03 DIAGNOSIS — L814 Other melanin hyperpigmentation: Secondary | ICD-10-CM | POA: Diagnosis not present

## 2022-01-03 NOTE — Patient Instructions (Addendum)
Cryotherapy Aftercare  Wash gently with soap and water everyday.   Apply Vaseline and Band-Aid daily until healed.     Due to recent changes in healthcare laws, you may see results of your pathology and/or laboratory studies on MyChart before the doctors have had a chance to review them. We understand that in some cases there may be results that are confusing or concerning to you. Please understand that not all results are received at the same time and often the doctors may need to interpret multiple results in order to provide you with the best plan of care or course of treatment. Therefore, we ask that you please give us 2 business days to thoroughly review all your results before contacting the office for clarification. Should we see a critical lab result, you will be contacted sooner.   If You Need Anything After Your Visit  If you have any questions or concerns for your doctor, please call our main line at [phone] and press option 4 to reach your doctor's medical assistant. If no one answers, please leave a voicemail as directed and we will return your call as soon as possible. Messages left after 4 pm will be answered the following business day.   You may also send us a message via MyChart. We typically respond to MyChart messages within 1-2 business days.  For prescription refills, please ask your pharmacy to contact our office. Our fax number is [phone].  If you have an urgent issue when the clinic is closed that cannot wait until the next business day, you can page your doctor at the number below.    Please note that while we do our best to be available for urgent issues outside of office hours, we are not available 24/7.   If you have an urgent issue and are unable to reach us, you may choose to seek medical care at your doctor's office, retail clinic, urgent care center, or emergency room.  If you have a medical emergency, please immediately call 911 or go to the  emergency department.  Pager Numbers  - Dr. Kowalski: [phone]  - Dr. Moye: [phone]  - Dr. Stewart: [phone]  In the event of inclement weather, please call our main line at [phone] for an update on the status of any delays or closures.  Dermatology Medication Tips: Please keep the boxes that topical medications come in in order to help keep track of the instructions about where and how to use these. Pharmacies typically print the medication instructions only on the boxes and not directly on the medication tubes.   If your medication is too expensive, please contact our office at [phone] option 4 or send us a message through MyChart.   We are unable to tell what your co-pay for medications will be in advance as this is different depending on your insurance coverage. However, we may be able to find a substitute medication at lower cost or fill out paperwork to get insurance to cover a needed medication.   If a prior authorization is required to get your medication covered by your insurance company, please allow us 1-2 business days to complete this process.  Drug prices often vary depending on where the prescription is filled and some pharmacies may offer cheaper prices.  The website www.goodrx.com contains coupons for medications through different pharmacies. The prices here do not account for what the cost may be with help from insurance (it may be cheaper with your insurance), but the website can   give you the price if you did not use any insurance.  - You can print the associated coupon and take it with your prescription to the pharmacy.  - You may also stop by our office during regular business hours and pick up a GoodRx coupon card.  - If you need your prescription sent electronically to a different pharmacy, notify our office through [facility] MyChart or by phone at [phone] option 4.     Si Usted Necesita Algo Despus de Su Visita  Tambin puede  enviarnos un mensaje a travs de MyChart. Por lo general respondemos a los mensajes de MyChart en el transcurso de 1 a 2 das hbiles.  Para renovar recetas, por favor pida a su farmacia que se ponga en contacto con nuestra oficina. Nuestro nmero de fax es el [phone].  Si tiene un asunto urgente cuando la clnica est cerrada y que no puede esperar hasta el siguiente da hbil, puede llamar/localizar a su doctor(a) al nmero que aparece a continuacin.   Por favor, tenga en cuenta que aunque hacemos todo lo posible para estar disponibles para asuntos urgentes fuera del horario de oficina, no estamos disponibles las 24 horas del da, los 7 das de la semana.   Si tiene un problema urgente y no puede comunicarse con nosotros, puede optar por buscar atencin mdica  en el consultorio de su doctor(a), en una clnica privada, en un centro de atencin urgente o en una sala de emergencias.  Si tiene una emergencia mdica, por favor llame inmediatamente al 911 o vaya a la sala de emergencias.  Nmeros de bper  - Dr. Kowalski: [phone]  - Dra. Moye: [phone]  - Dra. Stewart: [phone]  En caso de inclemencias del tiempo, por favor llame a nuestra lnea principal al [phone] para una actualizacin sobre el estado de cualquier retraso o cierre.  Consejos para la medicacin en dermatologa: Por favor, guarde las cajas en las que vienen los medicamentos de uso tpico para ayudarle a seguir las instrucciones sobre dnde y cmo usarlos. Las farmacias generalmente imprimen las instrucciones del medicamento slo en las cajas y no directamente en los tubos del medicamento.   Si su medicamento es muy caro, por favor, pngase en contacto con nuestra oficina llamando al [phone] y presione la opcin 4 o envenos un mensaje a travs de MyChart.   No podemos decirle cul ser su copago por los medicamentos por adelantado ya que esto es diferente dependiendo de la cobertura de su seguro.  Sin embargo, es posible que podamos encontrar un medicamento sustituto a menor costo o llenar un formulario para que el seguro cubra el medicamento que se considera necesario.   Si se requiere una autorizacin previa para que su compaa de seguros cubra su medicamento, por favor permtanos de 1 a 2 das hbiles para completar este proceso.  Los precios de los medicamentos varan con frecuencia dependiendo del lugar de dnde se surte la receta y alguna farmacias pueden ofrecer precios ms baratos.  El sitio web www.goodrx.com tiene cupones para medicamentos de diferentes farmacias. Los precios aqu no tienen en cuenta lo que podra costar con la ayuda del seguro (puede ser ms barato con su seguro), pero el sitio web puede darle el precio si no utiliz ningn seguro.  - Puede imprimir el cupn correspondiente y llevarlo con su receta a la farmacia.  - Tambin puede pasar por nuestra oficina durante el horario de atencin regular y recoger una tarjeta de cupones de GoodRx.  -   Si necesita que su receta se enve electrnicamente a una farmacia diferente, informe a nuestra oficina a travs de MyChart de [place] o por telfono llamando al [phone] y presione la opcin 4.  

## 2022-01-03 NOTE — Progress Notes (Signed)
Follow-Up Visit   Subjective  Trevor Carlson is a 87 y.o. male who presents for the following: Follow-up (Patient here today for 6 month AK follow up at Right Superior Helix x2, left nasal dorsum x1, right lateral cheek x2, right infraocular x1, right nasal dorsum x1, left forearm x3).  Patient has noticed a few spots at ears and upper back. Spot on back is itchy.  The following portions of the chart were reviewed this encounter and updated as appropriate:       Review of Systems:  No other skin or systemic complaints except as noted in HPI or Assessment and Plan.  Objective  Well appearing patient in no apparent distress; mood and affect are within normal limits.  A focused examination was performed including face, neck, chest and back. Relevant physical exam findings are noted in the Assessment and Plan.  L Posterior Ear x 3, L ear helix x 2, L antihelix x 1, R mid ear helix x 1, R shoulder x 2, L nasal ala rim x1 (10) Erythematous thin papules/macules with gritty scale.   Right Upper Back Subcutaneous nodule.   Left Spinal Upper Back Erythematous stuck-on, waxy papule    Assessment & Plan  AK (actinic keratosis) (10) L Posterior Ear x 3, L ear helix x 2, L antihelix x 1, R mid ear helix x 1, R shoulder x 2, L nasal ala rim x1  Actinic keratoses are precancerous spots that appear secondary to cumulative UV radiation exposure/sun exposure over time. They are chronic with expected duration over 1 year. A portion of actinic keratoses will progress to squamous cell carcinoma of the skin. It is not possible to reliably predict which spots will progress to skin cancer and so treatment is recommended to prevent development of skin cancer.  Recommend daily broad spectrum sunscreen SPF 30+ to sun-exposed areas, reapply every 2 hours as needed.  Recommend staying in the shade or wearing long sleeves, sun glasses (UVA+UVB protection) and wide brim hats (4-inch brim around the entire  circumference of the hat). Call for new or changing lesions.  Hypertrophic ar right ear helix  Destruction of lesion - L Posterior Ear x 3, L ear helix x 2, L antihelix x 1, R mid ear helix x 1, R shoulder x 2, L nasal ala rim x1  Destruction method: cryotherapy   Informed consent: discussed and consent obtained   Lesion destroyed using liquid nitrogen: Yes   Region frozen until ice ball extended beyond lesion: Yes   Outcome: patient tolerated procedure well with no complications   Post-procedure details: wound care instructions given   Additional details:  Prior to procedure, discussed risks of blister formation, small wound, skin dyspigmentation, or rare scar following cryotherapy. Recommend Vaseline ointment to treated areas while healing.   Epidermal inclusion cyst Right Upper Back  Benign-appearing. Exam most consistent with an epidermal inclusion cyst. Discussed that a cyst is a benign growth that can grow over time and sometimes get irritated or inflamed. Recommend observation if it is not bothersome. Discussed option of surgical excision to remove it if it is growing, symptomatic, or other changes noted. Please call for new or changing lesions so they can be evaluated.    Inflamed seborrheic keratosis Left Spinal Upper Back  Symptomatic, irritating, patient would like treated.   Destruction of lesion - Left Spinal Upper Back  Destruction method: cryotherapy   Informed consent: discussed and consent obtained   Lesion destroyed using liquid nitrogen: Yes  Region frozen until ice ball extended beyond lesion: Yes   Outcome: patient tolerated procedure well with no complications   Post-procedure details: wound care instructions given   Additional details:  Prior to procedure, discussed risks of blister formation, small wound, skin dyspigmentation, or rare scar following cryotherapy. Recommend Vaseline ointment to treated areas while healing.   Lentigines - Scattered tan  macules - Due to sun exposure - Benign-appering, observe - Recommend daily broad spectrum sunscreen SPF 30+ to sun-exposed areas, reapply every 2 hours as needed. - Call for any changes  Actinic Damage - chronic, secondary to cumulative UV radiation exposure/sun exposure over time - diffuse scaly erythematous macules with underlying dyspigmentation - Recommend daily broad spectrum sunscreen SPF 30+ to sun-exposed areas, reapply every 2 hours as needed.  - Recommend staying in the shade or wearing long sleeves, sun glasses (UVA+UVB protection) and wide brim hats (4-inch brim around the entire circumference of the hat). - Call for new or changing lesions.  Return in about 6 months (around 07/06/2022) for UBSE.  Graciella Belton, RMA, am acting as scribe for [name], MD .  Documentation: I have reviewed the above documentation for accuracy and completeness, and I agree with the above.  [name] MD

## 2022-03-05 ENCOUNTER — Ambulatory Visit
Admission: EM | Admit: 2022-03-05 | Discharge: 2022-03-05 | Disposition: A | Discharge status: Home / Self Care | Payer: Medicare Other | Attending: Emergency Medicine | Admitting: Emergency Medicine

## 2022-03-05 ENCOUNTER — Encounter: Payer: Self-pay | Admitting: Emergency Medicine

## 2022-03-05 DIAGNOSIS — S51811A Laceration without foreign body of right forearm, initial encounter: Secondary | ICD-10-CM

## 2022-03-05 DIAGNOSIS — W19XXXA Unspecified fall, initial encounter: Secondary | ICD-10-CM

## 2022-03-05 MED ORDER — TETANUS-DIPHTH-ACELL PERTUSSIS 5-2.5-18.5 LF-MCG/0.5 IM SUSY
0.5000 mL | PREFILLED_SYRINGE | Freq: Once | INTRAMUSCULAR | Status: AC
  Administered 2022-03-05: 0.5 mL via INTRAMUSCULAR

## 2022-03-05 NOTE — ED Triage Notes (Signed)
Pt tripped and fell yesterday and landed on his right arm. He has a skin tear to his arm and denies hitting his head.

## 2022-03-05 NOTE — Discharge Instructions (Addendum)
Your tetanus was updated today.    Keep your wound clean and dry.  Wash it gently twice a day with soap and water.  Apply an antibiotic cream and bandage twice a day for 2-3 days.  Then begin to allow it to be open to air.   Follow up right away if you see signs of infection, such as redness, pus-like drainage, warmth, fever, chills, or other concerning symptoms.

## 2022-03-05 NOTE — ED Provider Notes (Signed)
[name]    CSN: [id number] Arrival date & time: 03/05/22  3151      History   Chief Complaint Chief Complaint  Patient presents with   Fall    HPI Trevor Carlson is a 87 y.o. male.  Accompanied by his daughter, patient presents with a skin tear on his right forearm that occurred yesterday when he tripped and fell.  No head injury or loss of consciousness.  Treated at home with wound care.  He denies dizziness, numbness, weakness, paresthesias, arthralgias, chest pain, shortness of breath, or other symptoms.  Last tetanus unknown.  His medical history includes hypertension.  The history is provided by the patient, a relative and medical records.    Past Medical History:  Diagnosis Date   Actinic keratosis    Arthritis    osteoarthritis right knee   Basal cell carcinoma 06/2010   L nasal dorsum/med canthus, txted by Dr. Lacinda Axon, Mohs   History of hiatal hernia    Hypertension    Pre-syncope    Prostate cancer ([place])    Squamous cell carcinoma of skin 11/2012   SCC IS L ear concha   Squamous cell carcinoma of skin 05/25/2020   L post ear, EDC   Urinary incontinence    Wears partial dentures     Patient Active Problem List   Diagnosis Date Noted   Proctocolitis    Nausea and vomiting    Constipation    Lower GI bleed 05/16/2020    Past Surgical History:  Procedure Laterality Date   CATARACT EXTRACTION W/PHACO Right 02/09/2021   Procedure: CATARACT EXTRACTION PHACO AND INTRAOCULAR LENS PLACEMENT ([place]) RIGHT 9.25 00:50.2;  Surgeon: Eulogio Bear, MD;  Location: [facility];  Service: Ophthalmology;  Laterality: Right;   CATARACT EXTRACTION W/PHACO Left 02/22/2021   Procedure: CATARACT EXTRACTION PHACO AND INTRAOCULAR LENS PLACEMENT ([facility]) LEFT 7.42 00:44.1;  Surgeon: Eulogio Bear, MD;  Location: [facility];  Service: Ophthalmology;  Laterality: Left;   COLONOSCOPY  2015   dental implants     ESOPHAGOGASTRODUODENOSCOPY (EGD) WITH  PROPOFOL N/A 12/06/2016   Procedure: ESOPHAGOGASTRODUODENOSCOPY (EGD) WITH PROPOFOL;  Surgeon: Lollie Sails, MD;  Location: [facility] ENDOSCOPY;  Service: Endoscopy;  Laterality: N/A;   ESOPHAGOGASTRODUODENOSCOPY (EGD) WITH PROPOFOL N/A 01/10/2017   Procedure: ESOPHAGOGASTRODUODENOSCOPY (EGD) WITH PROPOFOL;  Surgeon: Lollie Sails, MD;  Location: [facility] ENDOSCOPY;  Service: Endoscopy;  Laterality: N/A;   PROSTATE SURGERY         Home Medications    Prior to Admission medications   Medication Sig Start Date End Date Taking? Authorizing Provider  albuterol (PROVENTIL HFA;VENTOLIN HFA) 108 (90 Base) MCG/ACT inhaler Inhale 1-2 puffs into the lungs every 6 (six) hours as needed for wheezing or shortness of breath. Patient not taking: Reported on 02/02/2021    [provider]  aspirin EC 81 MG tablet Take 81 mg by mouth daily.    [provider]  calcium citrate-vitamin D (CITRACAL+D) 315-200 MG-UNIT tablet Take 1 tablet by mouth 2 (two) times daily.    [provider]  Coenzyme Q10 (CO Q-10) 100 MG CAPS Take 1 capsule by mouth daily.    [provider]  glucosamine-chondroitin 500-400 MG tablet Take 1 tablet by mouth 2 (two) times daily.    [provider]  losartan (COZAAR) 50 MG tablet Take 50 mg by mouth daily.    [provider]  Multiple Vitamin (MULTIVITAMIN) tablet Take 1 tablet by mouth daily.  [provider]  naproxen sodium (ANAPROX) 220 MG tablet Take 220 mg by mouth 2 (two) times daily with a meal. Patient not taking: Reported on 02/02/2021    [provider]  OMEGA-3 FATTY ACIDS PO Take 1,000 mg by mouth daily.    [provider]  omeprazole (PRILOSEC) 40 MG capsule Take 40 mg by mouth daily.    [provider]  ondansetron (ZOFRAN-ODT) 4 MG disintegrating tablet Take 1 tablet (4 mg total) by mouth every 8 (eight) hours as needed for nausea or vomiting. 11/01/21   Rudene Re, MD  Plant  Sterols and Stanols (CHOLESTOFF PLUS PO) Take 2 tablets by mouth 2 (two) times daily.    [provider]  polyethylene glycol (MIRALAX / GLYCOLAX) 17 g packet Take 17 g by mouth daily. 05/19/20   Max Sane, MD  vitamin B-12 (CYANOCOBALAMIN) 1000 MCG tablet Take 1,000 mcg by mouth daily.    [provider]  vitamin C (ASCORBIC ACID) 500 MG tablet Take 500 mg by mouth daily.    [provider]    Family History History reviewed. No pertinent family history.  Social History Social History   Tobacco Use   Smoking status: Former    Packs/day: 1.00    Years: 35.00    Total pack years: 35.00    Types: Cigarettes    Quit date: 03/26/1974    Years since quitting: 47.9   Smokeless tobacco: Former    Quit date: 03/27/1979  Vaping Use   Vaping Use: Never used  Substance Use Topics   Alcohol use: No   Drug use: Never     Allergies   Patient has no known allergies.   Review of Systems Review of Systems  Constitutional:  Negative for chills and fever.  Musculoskeletal:  Negative for arthralgias and joint swelling.  Skin:  Positive for wound. Negative for color change.  Neurological:  Negative for weakness and numbness.  All other systems reviewed and are negative.    Physical Exam Triage Vital Signs ED Triage Vitals  Enc Vitals Group     BP 03/05/22 0942 114/61     Pulse Rate 03/05/22 0942 95     Resp 03/05/22 0942 18     Temp 03/05/22 0942 97.9 F (36.6 C)     Temp src --      SpO2 03/05/22 0942 95 %     Weight --      Height --      Head Circumference --      Peak Flow --      Pain Score 03/05/22 0945 0     Pain Loc --      Pain Edu? --      Excl. in [place]? --    No data found.  Updated Vital Signs BP 114/61   Pulse 95   Temp 97.9 F (36.6 C)   Resp 18   SpO2 95%   Visual Acuity Right Eye Distance:   Left Eye Distance:   Bilateral Distance:    Right Eye Near:   Left Eye Near:    Bilateral Near:     Physical Exam Vitals and  nursing note reviewed.  Constitutional:      General: He is not in acute distress.    Appearance: Normal appearance. He is well-developed. He is not ill-appearing.  HENT:     Mouth/Throat:     Mouth: Mucous membranes are moist.  Cardiovascular:     Rate and Rhythm: Normal  rate and regular rhythm.  Pulmonary:     Effort: Pulmonary effort is normal. No respiratory distress.  Musculoskeletal:        General: No swelling, tenderness or deformity. Normal range of motion.     Cervical back: Neck supple.  Skin:    General: Skin is warm and dry.     Capillary Refill: Capillary refill takes less than 2 seconds.     Findings: Lesion present. No erythema.     Comments: 4 cm x 4 cm superficial skin tear on right forearm.  No bleeding.  Neurological:     Mental Status: He is alert.     Sensory: No sensory deficit.     Motor: No weakness.  Psychiatric:        Mood and Affect: Mood normal.        Behavior: Behavior normal.      UC Treatments / Results  Labs (all labs ordered are listed, but only abnormal results are displayed) Labs Reviewed - No data to display  EKG   Radiology No results found.  Procedures Procedures (including critical care time)  Medications Ordered in UC Medications  Tdap (BOOSTRIX) injection 0.5 mL (0.5 mLs Intramuscular Given 03/05/22 1014)    Initial Impression / Assessment and Plan / UC Course  I have reviewed the triage vital signs and the nursing notes.  Pertinent labs & imaging results that were available during my care of the patient were reviewed by me and considered in my medical decision making (see chart for details).    Skin tear of right forearm due to fall that occurred yesterday at home.  Wound cleaned; skin unfolded and placed over wound.  Dressed with antibiotic ointment and nonadherent dressing.  Tetanus updated.  Wound care instructions and signs of infection discussed.  Instructed patient to follow-up with his PCP or return here right  away if he notes signs of infection.  Education provided on skin tears and fall prevention at home.  Patient and his daughter agree to plan of care.  Final Clinical Impressions(s) / UC Diagnoses   Final diagnoses:  Skin tear of right forearm without complication, initial encounter  Fall, initial encounter     Discharge Instructions      Your tetanus was updated today.    Keep your wound clean and dry.  Wash it gently twice a day with soap and water.  Apply an antibiotic cream and bandage twice a day for 2-3 days.  Then begin to allow it to be open to air.   Follow up right away if you see signs of infection, such as redness, pus-like drainage, warmth, fever, chills, or other concerning symptoms.        ED Prescriptions   None    PDMP not reviewed this encounter.   Sharion Balloon, NP 03/05/22 1034

## 2022-04-22 ENCOUNTER — Ambulatory Visit (LOCAL_COMMUNITY_HEALTH_CENTER): Payer: Medicare Other

## 2022-04-22 DIAGNOSIS — Z719 Counseling, unspecified: Secondary | ICD-10-CM

## 2022-04-22 DIAGNOSIS — Z23 Encounter for immunization: Secondary | ICD-10-CM | POA: Diagnosis not present

## 2022-04-22 NOTE — Progress Notes (Signed)
  Are you feeling sick today? No   Have you ever received a dose of COVID-19 Vaccine? [employer], [place], [place], [place], Other) Yes  If yes, which vaccine and how many doses?   PFIZER, 4   Did you bring the vaccination record card or other documentation?  Yes   Do you have a health condition or are undergoing treatment that makes you moderately or severely immunocompromised? This would include, but not be limited to: cancer, HIV, organ transplant, immunosuppressive therapy/high-dose corticosteroids, or moderate/severe primary immunodeficiency.  No  Have you received COVID-19 vaccine before or during hematopoietic cell transplant (HCT) or CAR-T-cell therapies? No  Have you ever had an allergic reaction to: (This would include a severe allergic reaction or a reaction that caused hives, swelling, or respiratory distress, including wheezing.) A component of a COVID-19 vaccine or a previous dose of COVID-19 vaccine? No   Have you ever had an allergic reaction to another vaccine (other thanCOVID-19 vaccine) or an injectable medication? (This would include a severe allergic reaction or a reaction that caused hives, swelling, or respiratory distress, including wheezing.)   No    Do you have a history of any of the following:  Myocarditis or Pericarditis No  Dermal fillers:  No  Multisystem Inflammatory Syndrome (MIS-C or MIS-A)? No  COVID-19 disease within the past 3 months? No  Vaccinated with monkeypox vaccine in the last 4 weeks? No  Eligible and administered Comirnaty 12y+2023-2024, monitored, tolerated well. Verbalized understanding of VIS and NCIR copy. M.Christopherjohn Schiele, LPN.

## 2022-07-25 ENCOUNTER — Ambulatory Visit: Payer: Medicare Other | Admitting: Dermatology

## 2022-07-25 DIAGNOSIS — L720 Epidermal cyst: Secondary | ICD-10-CM

## 2022-07-25 DIAGNOSIS — L814 Other melanin hyperpigmentation: Secondary | ICD-10-CM

## 2022-07-25 DIAGNOSIS — Z85828 Personal history of other malignant neoplasm of skin: Secondary | ICD-10-CM | POA: Diagnosis not present

## 2022-07-25 DIAGNOSIS — Z1283 Encounter for screening for malignant neoplasm of skin: Secondary | ICD-10-CM

## 2022-07-25 DIAGNOSIS — Z86007 Personal history of in-situ neoplasm of skin: Secondary | ICD-10-CM | POA: Diagnosis not present

## 2022-07-25 DIAGNOSIS — L570 Actinic keratosis: Secondary | ICD-10-CM

## 2022-07-25 DIAGNOSIS — L578 Other skin changes due to chronic exposure to nonionizing radiation: Secondary | ICD-10-CM

## 2022-07-25 DIAGNOSIS — D229 Melanocytic nevi, unspecified: Secondary | ICD-10-CM

## 2022-07-25 DIAGNOSIS — L820 Inflamed seborrheic keratosis: Secondary | ICD-10-CM

## 2022-07-25 DIAGNOSIS — L821 Other seborrheic keratosis: Secondary | ICD-10-CM

## 2022-07-25 NOTE — Progress Notes (Signed)
Follow-Up Visit   Subjective  Trevor Carlson is a 88 y.o. male who presents for the following: UBSE (Hx SCC, BCC, AK's. Patient has a spot at right eyebrow, left ear and one at back.). Spot on back itches.  The patient presents for Upper Body Skin Exam (UBSE) for skin cancer screening and mole check.  The patient has spots, moles and lesions to be evaluated, some may be new or changing and the patient has concerns that these could be cancer.   The following portions of the chart were reviewed this encounter and updated as appropriate:       Review of Systems:  No other skin or systemic complaints except as noted in HPI or Assessment and Plan.  Objective  Well appearing patient in no apparent distress; mood and affect are within normal limits.  All skin waist up examined.  R upper eyebrow White pap  L lower ear helix x 1, R nasal dorsum x 1, L nasal dorsum x 1, mid nasal dorsum x 1 (4) Pink scaly macules  L post shoulder x 1, R post shoulder x 1 (2) Stuck on waxy paps with erythema  R spinal upper back Firm SQ nodule    Assessment & Plan  Milia R upper eyebrow  Benign, observe.    Discussed extraction if symptomatic- pt defers  AK (actinic keratosis) (4) L lower ear helix x 1, R nasal dorsum x 1, L nasal dorsum x 1, mid nasal dorsum x 1  Destruction of lesion - L lower ear helix x 1, R nasal dorsum x 1, L nasal dorsum x 1, mid nasal dorsum x 1  Destruction method: cryotherapy   Informed consent: discussed and consent obtained   Lesion destroyed using liquid nitrogen: Yes   Region frozen until ice ball extended beyond lesion: Yes   Outcome: patient tolerated procedure well with no complications   Post-procedure details: wound care instructions given   Additional details:  Prior to procedure, discussed risks of blister formation, small wound, skin dyspigmentation, or rare scar following cryotherapy. Recommend Vaseline ointment to treated areas while healing.    Inflamed seborrheic keratosis (2) L post shoulder x 1, R post shoulder x 1  Symptomatic, irritating, patient would like treated.   Destruction of lesion - L post shoulder x 1, R post shoulder x 1  Destruction method: cryotherapy   Informed consent: discussed and consent obtained   Lesion destroyed using liquid nitrogen: Yes   Region frozen until ice ball extended beyond lesion: Yes   Outcome: patient tolerated procedure well with no complications   Post-procedure details: wound care instructions given   Additional details:  Prior to procedure, discussed risks of blister formation, small wound, skin dyspigmentation, or rare scar following cryotherapy. Recommend Vaseline ointment to treated areas while healing.   Epidermal cyst R spinal upper back  Benign-appearing. Exam most consistent with an epidermal inclusion cyst. Discussed that a cyst is a benign growth that can grow over time and sometimes get irritated or inflamed. Recommend observation if it is not bothersome. Discussed option of surgical excision to remove it if it is growing, symptomatic, or other changes noted. Please call for new or changing lesions so they can be evaluated.     History of Basal Cell Carcinoma of the Skin - No evidence of recurrence today - Recommend regular full body skin exams - Recommend daily broad spectrum sunscreen SPF 30+ to sun-exposed areas, reapply every 2 hours as needed.  - Call if  any new or changing lesions are noted between office visits - L nasal dorsum/med canthus  History of Squamous Cell Carcinoma of the Skin - No evidence of recurrence today - Recommend regular full body skin exams - Recommend daily broad spectrum sunscreen SPF 30+ to sun-exposed areas, reapply every 2 hours as needed.  - Call if any new or changing lesions are noted between office visits - L post ear  History of Squamous Cell Carcinoma in Situ of the Skin - No evidence of recurrence today - Recommend  regular full body skin exams - Recommend daily broad spectrum sunscreen SPF 30+ to sun-exposed areas, reapply every 2 hours as needed.  - Call if any new or changing lesions are noted between office visits  - L ear concha  Lentigines - Scattered tan macules - Due to sun exposure - Benign-appearing, observe - Recommend daily broad spectrum sunscreen SPF 30+ to sun-exposed areas, reapply every 2 hours as needed. - Call for any changes  Seborrheic Keratoses - Stuck-on, waxy, tan-brown papules and/or plaques  - Benign-appearing - Discussed benign etiology and prognosis. - Observe - Call for any changes  Melanocytic Nevi - Tan-brown and/or pink-flesh-colored symmetric macules and papules - Benign appearing on exam today - Observation - Call clinic for new or changing moles - Recommend daily use of broad spectrum spf 30+ sunscreen to sun-exposed areas.   Hemangiomas - Red papules - Discussed benign nature - Observe - Call for any changes  Actinic Damage - Chronic condition, secondary to cumulative UV/sun exposure - diffuse scaly erythematous macules with underlying dyspigmentation - Recommend daily broad spectrum sunscreen SPF 30+ to sun-exposed areas, reapply every 2 hours as needed.  - Staying in the shade or wearing long sleeves, sun glasses (UVA+UVB protection) and wide brim hats (4-inch brim around the entire circumference of the hat) are also recommended for sun protection.  - Call for new or changing lesions.  Skin cancer screening performed today.  Return in about 6 months (around 01/23/2023) for Hx of AKs.  Graciella Belton, RMA, am acting as scribe for [name], MD . I, [name], RMA, am acting as scribe for [name], MD .  Documentation: I have reviewed the above documentation for accuracy and completeness, and I agree with the above.  [name] MD

## 2022-07-25 NOTE — Patient Instructions (Signed)
Cryotherapy Aftercare  Wash gently with soap and water everyday.   Apply Vaseline and Band-Aid daily until healed.     Due to recent changes in healthcare laws, you may see results of your pathology and/or laboratory studies on MyChart before the doctors have had a chance to review them. We understand that in some cases there may be results that are confusing or concerning to you. Please understand that not all results are received at the same time and often the doctors may need to interpret multiple results in order to provide you with the best plan of care or course of treatment. Therefore, we ask that you please give us 2 business days to thoroughly review all your results before contacting the office for clarification. Should we see a critical lab result, you will be contacted sooner.   If You Need Anything After Your Visit  If you have any questions or concerns for your doctor, please call our main line at [phone] and press option 4 to reach your doctor's medical assistant. If no one answers, please leave a voicemail as directed and we will return your call as soon as possible. Messages left after 4 pm will be answered the following business day.   You may also send us a message via MyChart. We typically respond to MyChart messages within 1-2 business days.  For prescription refills, please ask your pharmacy to contact our office. Our fax number is [phone].  If you have an urgent issue when the clinic is closed that cannot wait until the next business day, you can page your doctor at the number below.    Please note that while we do our best to be available for urgent issues outside of office hours, we are not available 24/7.   If you have an urgent issue and are unable to reach us, you may choose to seek medical care at your doctor's office, retail clinic, urgent care center, or emergency room.  If you have a medical emergency, please immediately call 911 or go to the  emergency department.  Pager Numbers  - Dr. Kowalski: [phone]  - Dr. Moye: [phone]  - Dr. Stewart: [phone]  In the event of inclement weather, please call our main line at [phone] for an update on the status of any delays or closures.  Dermatology Medication Tips: Please keep the boxes that topical medications come in in order to help keep track of the instructions about where and how to use these. Pharmacies typically print the medication instructions only on the boxes and not directly on the medication tubes.   If your medication is too expensive, please contact our office at [phone] option 4 or send us a message through MyChart.   We are unable to tell what your co-pay for medications will be in advance as this is different depending on your insurance coverage. However, we may be able to find a substitute medication at lower cost or fill out paperwork to get insurance to cover a needed medication.   If a prior authorization is required to get your medication covered by your insurance company, please allow us 1-2 business days to complete this process.  Drug prices often vary depending on where the prescription is filled and some pharmacies may offer cheaper prices.  The website www.goodrx.com contains coupons for medications through different pharmacies. The prices here do not account for what the cost may be with help from insurance (it may be cheaper with your insurance), but the website can   give you the price if you did not use any insurance.  - You can print the associated coupon and take it with your prescription to the pharmacy.  - You may also stop by our office during regular business hours and pick up a GoodRx coupon card.  - If you need your prescription sent electronically to a different pharmacy, notify our office through [facility] MyChart or by phone at [phone] option 4.     Si Usted Necesita Algo Despus de Su Visita  Tambin puede  enviarnos un mensaje a travs de MyChart. Por lo general respondemos a los mensajes de MyChart en el transcurso de 1 a 2 das hbiles.  Para renovar recetas, por favor pida a su farmacia que se ponga en contacto con nuestra oficina. Nuestro nmero de fax es el [phone].  Si tiene un asunto urgente cuando la clnica est cerrada y que no puede esperar hasta el siguiente da hbil, puede llamar/localizar a su doctor(a) al nmero que aparece a continuacin.   Por favor, tenga en cuenta que aunque hacemos todo lo posible para estar disponibles para asuntos urgentes fuera del horario de oficina, no estamos disponibles las 24 horas del da, los 7 das de la semana.   Si tiene un problema urgente y no puede comunicarse con nosotros, puede optar por buscar atencin mdica  en el consultorio de su doctor(a), en una clnica privada, en un centro de atencin urgente o en una sala de emergencias.  Si tiene una emergencia mdica, por favor llame inmediatamente al 911 o vaya a la sala de emergencias.  Nmeros de bper  - Dr. Kowalski: [phone]  - Dra. Moye: [phone]  - Dra. Stewart: [phone]  En caso de inclemencias del tiempo, por favor llame a nuestra lnea principal al [phone] para una actualizacin sobre el estado de cualquier retraso o cierre.  Consejos para la medicacin en dermatologa: Por favor, guarde las cajas en las que vienen los medicamentos de uso tpico para ayudarle a seguir las instrucciones sobre dnde y cmo usarlos. Las farmacias generalmente imprimen las instrucciones del medicamento slo en las cajas y no directamente en los tubos del medicamento.   Si su medicamento es muy caro, por favor, pngase en contacto con nuestra oficina llamando al [phone] y presione la opcin 4 o envenos un mensaje a travs de MyChart.   No podemos decirle cul ser su copago por los medicamentos por adelantado ya que esto es diferente dependiendo de la cobertura de su seguro.  Sin embargo, es posible que podamos encontrar un medicamento sustituto a menor costo o llenar un formulario para que el seguro cubra el medicamento que se considera necesario.   Si se requiere una autorizacin previa para que su compaa de seguros cubra su medicamento, por favor permtanos de 1 a 2 das hbiles para completar este proceso.  Los precios de los medicamentos varan con frecuencia dependiendo del lugar de dnde se surte la receta y alguna farmacias pueden ofrecer precios ms baratos.  El sitio web www.goodrx.com tiene cupones para medicamentos de diferentes farmacias. Los precios aqu no tienen en cuenta lo que podra costar con la ayuda del seguro (puede ser ms barato con su seguro), pero el sitio web puede darle el precio si no utiliz ningn seguro.  - Puede imprimir el cupn correspondiente y llevarlo con su receta a la farmacia.  - Tambin puede pasar por nuestra oficina durante el horario de atencin regular y recoger una tarjeta de cupones de GoodRx.  -   Si necesita que su receta se enve electrnicamente a una farmacia diferente, informe a nuestra oficina a travs de MyChart de  o por telfono llamando al [phone] y presione la opcin 4.  

## 2022-10-15 ENCOUNTER — Ambulatory Visit (INDEPENDENT_AMBULATORY_CARE_PROVIDER_SITE_OTHER): Payer: Medicare Other

## 2022-10-15 ENCOUNTER — Ambulatory Visit
Admission: EM | Admit: 2022-10-15 | Discharge: 2022-10-15 | Disposition: A | Discharge status: Home / Self Care | Payer: Medicare Other | Attending: Emergency Medicine | Admitting: Emergency Medicine

## 2022-10-15 DIAGNOSIS — J440 Chronic obstructive pulmonary disease with acute lower respiratory infection: Secondary | ICD-10-CM | POA: Insufficient documentation

## 2022-10-15 DIAGNOSIS — I700 Atherosclerosis of aorta: Secondary | ICD-10-CM | POA: Diagnosis not present

## 2022-10-15 DIAGNOSIS — R051 Acute cough: Secondary | ICD-10-CM

## 2022-10-15 DIAGNOSIS — Z1152 Encounter for screening for COVID-19: Secondary | ICD-10-CM | POA: Diagnosis not present

## 2022-10-15 DIAGNOSIS — Z87891 Personal history of nicotine dependence: Secondary | ICD-10-CM | POA: Diagnosis not present

## 2022-10-15 DIAGNOSIS — Z85828 Personal history of other malignant neoplasm of skin: Secondary | ICD-10-CM | POA: Insufficient documentation

## 2022-10-15 DIAGNOSIS — Z8546 Personal history of malignant neoplasm of prostate: Secondary | ICD-10-CM | POA: Insufficient documentation

## 2022-10-15 DIAGNOSIS — K449 Diaphragmatic hernia without obstruction or gangrene: Secondary | ICD-10-CM | POA: Insufficient documentation

## 2022-10-15 DIAGNOSIS — R0602 Shortness of breath: Secondary | ICD-10-CM

## 2022-10-15 DIAGNOSIS — I10 Essential (primary) hypertension: Secondary | ICD-10-CM | POA: Insufficient documentation

## 2022-10-15 DIAGNOSIS — J209 Acute bronchitis, unspecified: Secondary | ICD-10-CM | POA: Diagnosis not present

## 2022-10-15 MED ORDER — ALBUTEROL SULFATE HFA 108 (90 BASE) MCG/ACT IN AERS: 1.0000 | INHALATION_SPRAY | Freq: Four times a day (QID) | RESPIRATORY_TRACT | 0 refills | Status: DC | PRN

## 2022-10-15 MED ORDER — PREDNISONE 10 MG PO TABS: 40.0000 mg | ORAL_TABLET | Freq: Every day | ORAL | 0 refills | Status: AC

## 2022-10-15 MED ORDER — AZITHROMYCIN 250 MG PO TABS: 250.0000 mg | ORAL_TABLET | Freq: Every day | ORAL | 0 refills | Status: DC

## 2022-10-15 NOTE — ED Provider Notes (Signed)
Trevor Carlson    CSN: [id number] Arrival date & time: 10/15/22  1452      History   Chief Complaint Chief Complaint  Patient presents with   Nasal Congestion    HPI Trevor Carlson is a 88 y.o. male.  Accompanied by his daughter, patient presents with congestion, cough, wheezing, shortness of breath.  No fever, rash, chest pain, or other symptoms.  He has been using his albuterol inhaler but needs a refill.  He has taken OTC cold medication.  His medical history includes hypertension, prostate cancer, skin cancer, arthritis.  The history is provided by the patient, a relative and medical records.    Past Medical History:  Diagnosis Date   Actinic keratosis    Arthritis    osteoarthritis right knee   Basal cell carcinoma 06/2010   L nasal dorsum/med canthus, txted by Dr. Adriana Simas, Mohs   History of hiatal hernia    Hypertension    Pre-syncope    Prostate cancer    Squamous cell carcinoma of skin 11/2012   SCC IS L ear concha   Squamous cell carcinoma of skin 05/25/2020   L post ear, [facility]   Urinary incontinence    Wears partial dentures     Patient Active Problem List   Diagnosis Date Noted   Proctocolitis    Nausea and vomiting    Constipation    Lower GI bleed 05/16/2020    Past Surgical History:  Procedure Laterality Date   CATARACT EXTRACTION W/PHACO Right 02/09/2021   Procedure: CATARACT EXTRACTION PHACO AND INTRAOCULAR LENS PLACEMENT (IOC) RIGHT 9.25 00:50.2;  Surgeon: Nevada Crane, MD;  Location: [facility];  Service: Ophthalmology;  Laterality: Right;   CATARACT EXTRACTION W/PHACO Left 02/22/2021   Procedure: CATARACT EXTRACTION PHACO AND INTRAOCULAR LENS PLACEMENT (IOC) LEFT 7.42 00:44.1;  Surgeon: Nevada Crane, MD;  Location: [facility];  Service: Ophthalmology;  Laterality: Left;   COLONOSCOPY  2015   dental implants     ESOPHAGOGASTRODUODENOSCOPY (EGD) WITH PROPOFOL N/A 12/06/2016   Procedure: ESOPHAGOGASTRODUODENOSCOPY  (EGD) WITH PROPOFOL;  Surgeon: Christena Deem, MD;  Location: [facility];  Service: Endoscopy;  Laterality: N/A;   ESOPHAGOGASTRODUODENOSCOPY (EGD) WITH PROPOFOL N/A 01/10/2017   Procedure: ESOPHAGOGASTRODUODENOSCOPY (EGD) WITH PROPOFOL;  Surgeon: Christena Deem, MD;  Location: [facility];  Service: Endoscopy;  Laterality: N/A;   PROSTATE SURGERY         Home Medications    Prior to Admission medications   Medication Sig Start Date End Date Taking? Authorizing Provider  albuterol (VENTOLIN HFA) 108 (90 Base) MCG/ACT inhaler Inhale 1-2 puffs into the lungs every 6 (six) hours as needed. 10/15/22  Yes Mickie Bail, NP  azithromycin (ZITHROMAX) 250 MG tablet Take 1 tablet (250 mg total) by mouth daily. Take first 2 tablets together, then 1 every day until finished. 10/15/22  Yes Mickie Bail, NP  predniSONE (DELTASONE) 10 MG tablet Take 4 tablets (40 mg total) by mouth daily for 5 days. 10/15/22 10/20/22 Yes Mickie Bail, NP  aspirin EC 81 MG tablet Take 81 mg by mouth daily.    [provider]  calcium citrate-vitamin D (CITRACAL+D) 315-200 MG-UNIT tablet Take 1 tablet by mouth 2 (two) times daily.    [provider]  Coenzyme Q10 (CO Q-10) 100 MG CAPS Take 1 capsule by mouth daily.    [provider]  glucosamine-chondroitin 500-400 MG tablet Take 1 tablet by mouth 2 (two) times daily.  [provider]  losartan (COZAAR) 50 MG tablet Take 50 mg by mouth daily.    [provider]  Multiple Vitamin (MULTIVITAMIN) tablet Take 1 tablet by mouth daily.    [provider]  naproxen sodium (ANAPROX) 220 MG tablet Take 220 mg by mouth 2 (two) times daily with a meal. Patient not taking: Reported on 02/02/2021    [provider]  OMEGA-3 FATTY ACIDS PO Take 1,000 mg by mouth daily.    [provider]  omeprazole (PRILOSEC) 40 MG capsule Take 40 mg by mouth daily.    [provider]  ondansetron (ZOFRAN-ODT)  4 MG disintegrating tablet Take 1 tablet (4 mg total) by mouth every 8 (eight) hours as needed for nausea or vomiting. 11/01/21   Nita Sickle, MD  Plant Sterols and Stanols (CHOLESTOFF PLUS PO) Take 2 tablets by mouth 2 (two) times daily.    [provider]  polyethylene glycol (MIRALAX / GLYCOLAX) 17 g packet Take 17 g by mouth daily. 05/19/20   Delfino Lovett, MD  vitamin B-12 (CYANOCOBALAMIN) 1000 MCG tablet Take 1,000 mcg by mouth daily.    [provider]  vitamin C (ASCORBIC ACID) 500 MG tablet Take 500 mg by mouth daily.    [provider]    Family History History reviewed. No pertinent family history.  Social History Social History   Tobacco Use   Smoking status: Former    Packs/day: 1.00    Years: 35.00    Additional pack years: 0.00    Total pack years: 35.00    Types: Cigarettes    Quit date: 03/26/1974    Years since quitting: 48.5   Smokeless tobacco: Former    Quit date: 03/27/1979  Vaping Use   Vaping Use: Never used  Substance Use Topics   Alcohol use: No   Drug use: Never     Allergies   Patient has no known allergies.   Review of Systems Review of Systems  Constitutional:  Negative for chills and fever.  HENT:  Positive for congestion. Negative for ear pain and sore throat.   Respiratory:  Positive for cough, shortness of breath and wheezing.   Cardiovascular:  Negative for chest pain and palpitations.  All other systems reviewed and are negative.    Physical Exam Triage Vital Signs ED Triage Vitals  Enc Vitals Group     BP 10/15/22 1535 122/88     Pulse Rate 10/15/22 1521 85     Resp 10/15/22 1521 18     Temp 10/15/22 1521 98.2 F (36.8 C)     Temp src --      SpO2 10/15/22 1521 95 %     Weight --      Height --      Head Circumference --      Peak Flow --      Pain Score 10/15/22 1526 0     Pain Loc --      Pain Edu? --      Excl. in GC? --    No data found.  Updated Vital Signs BP 122/88   Pulse 85    Temp 98.2 F (36.8 C)   Resp 18   SpO2 95%   Visual Acuity Right Eye Distance:   Left Eye Distance:   Bilateral Distance:    Right Eye Near:   Left Eye Near:    Bilateral Near:     Physical Exam Vitals and nursing note reviewed.  Constitutional:  General: He is not in acute distress.    Appearance: Normal appearance. He is well-developed. He is not ill-appearing.  HENT:     Right Ear: Tympanic membrane normal.     Left Ear: Tympanic membrane normal.     Nose: Nose normal.     Mouth/Throat:     Mouth: Mucous membranes are moist.     Pharynx: Oropharynx is clear.  Cardiovascular:     Rate and Rhythm: Normal rate and regular rhythm.     Heart sounds: Normal heart sounds.  Pulmonary:     Effort: Pulmonary effort is normal. No respiratory distress.     Breath sounds: Wheezing and rhonchi present.     Comments: Bilateral wheezes and rhonchi. Musculoskeletal:     Cervical back: Neck supple.  Skin:    General: Skin is warm and dry.  Neurological:     Mental Status: He is alert.  Psychiatric:        Mood and Affect: Mood normal.        Behavior: Behavior normal.      UC Treatments / Results  Labs (all labs ordered are listed, but only abnormal results are displayed) Labs Reviewed  SARS CORONAVIRUS 2 (TAT 6-24 HRS)    EKG   Radiology DG Chest 2 View  Result Date: 10/15/2022 CLINICAL DATA:  Productive cough, wheezing, and shortness of breath for several days. EXAM: CHEST - 2 VIEW COMPARISON:  10/31/2021 and 09/15/2016 FINDINGS: Heart size is normal. Aortic atherosclerotic calcification incidentally noted. A small hiatal hernia is again seen. Pulmonary hyperinflation, consistent with COPD. No evidence of pulmonary infiltrate or edema. No evidence of pleural effusion. Old vertebral body compression fracture near the thoracolumbar junction again noted. IMPRESSION: COPD. No active lung disease. Small hiatal hernia. Electronically Signed   By: Danae Orleans M.D.   On:  10/15/2022 16:14    Procedures Procedures (including critical care time)  Medications Ordered in UC Medications - No data to display  Initial Impression / Assessment and Plan / UC Course  I have reviewed the triage vital signs and the nursing notes.  Pertinent labs & imaging results that were available during my care of the patient were reviewed by me and considered in my medical decision making (see chart for details).   Acute bronchitis, shortness of breath, cough.  Afebrile, VSS.  CXR shows COPD but negative for acute finding.  Treating with albuterol inhaler, prednisone, and Zithromax.  Instructed patient to follow up with his PCP on Monday.  ED precautions given.  Education provided on acute bronchitis and shortness of breath.  Patient agrees to plan of care.    Final Clinical Impressions(s) / UC Diagnoses   Final diagnoses:  Acute cough  Shortness of breath  Acute bronchitis, unspecified organism     Discharge Instructions      Use the albuterol inhaler as directed.  Take the prednisone and Zithromax as directed.    Follow up with your primary care provider on Monday.  Go to the emergency department if your have worsening symptoms.        ED Prescriptions     Medication Sig Dispense Auth. Provider   albuterol (VENTOLIN HFA) 108 (90 Base) MCG/ACT inhaler Inhale 1-2 puffs into the lungs every 6 (six) hours as needed. 18 g Mickie Bail, NP   predniSONE (DELTASONE) 10 MG tablet Take 4 tablets (40 mg total) by mouth daily for 5 days. 20 tablet Mickie Bail, NP   azithromycin (ZITHROMAX) 250 MG  tablet Take 1 tablet (250 mg total) by mouth daily. Take first 2 tablets together, then 1 every day until finished. 6 tablet Mickie Bail, NP      PDMP not reviewed this encounter.   Mickie Bail, NP 10/15/22 [phone]

## 2022-10-15 NOTE — ED Triage Notes (Addendum)
Patient to Urgent Care with complaints of head/ nasal/ chest congestion and shortness of breath. Productive cough. Wheezing.   Symptoms started Wednesday. Taken multiple otc cold and flu medications with little relief. Denies any known fevers. Using an albuterol inhaler (states he needs a refill and has a pcp appointment in 2 weeks).

## 2022-10-15 NOTE — Discharge Instructions (Addendum)
Use the albuterol inhaler as directed.  Take the prednisone and Zithromax as directed.    Follow up with your primary care provider on Monday.  Go to the emergency department if your have worsening symptoms.

## 2022-10-16 LAB — SARS CORONAVIRUS 2 (TAT 6-24 HRS): SARS Coronavirus 2: NEGATIVE

## 2022-10-31 ENCOUNTER — Other Ambulatory Visit: Payer: Self-pay | Admitting: Internal Medicine

## 2022-10-31 DIAGNOSIS — R053 Chronic cough: Secondary | ICD-10-CM

## 2022-10-31 DIAGNOSIS — R0981 Nasal congestion: Secondary | ICD-10-CM

## 2022-10-31 DIAGNOSIS — Z0000 Encounter for general adult medical examination without abnormal findings: Secondary | ICD-10-CM

## 2022-10-31 DIAGNOSIS — Z87891 Personal history of nicotine dependence: Secondary | ICD-10-CM

## 2022-11-03 ENCOUNTER — Ambulatory Visit
Admission: RE | Admit: 2022-11-03 | Discharge: 2022-11-03 | Disposition: A | Discharge status: Home / Self Care | Payer: Medicare Other | Source: Ambulatory Visit | Attending: Internal Medicine | Admitting: Internal Medicine

## 2022-11-03 DIAGNOSIS — R053 Chronic cough: Secondary | ICD-10-CM | POA: Insufficient documentation

## 2022-11-03 DIAGNOSIS — Z0000 Encounter for general adult medical examination without abnormal findings: Secondary | ICD-10-CM | POA: Diagnosis present

## 2022-11-03 DIAGNOSIS — R0981 Nasal congestion: Secondary | ICD-10-CM | POA: Diagnosis present

## 2022-11-03 DIAGNOSIS — Z87891 Personal history of nicotine dependence: Secondary | ICD-10-CM | POA: Diagnosis present

## 2022-11-03 MED ORDER — IOHEXOL 300 MG/ML  SOLN
75.0000 mL | Freq: Once | INTRAMUSCULAR | Status: AC | PRN
  Administered 2022-11-03: 75 mL via INTRAVENOUS

## 2022-12-11 ENCOUNTER — Emergency Department
Admission: EM | Admit: 2022-12-11 | Discharge: 2022-12-11 | Disposition: A | Discharge status: Home / Self Care | Payer: Medicare Other | Attending: Student in an Organized Health Care Education/Training Program | Admitting: Student in an Organized Health Care Education/Training Program

## 2022-12-11 ENCOUNTER — Encounter: Payer: Self-pay | Admitting: Emergency Medicine

## 2022-12-11 ENCOUNTER — Emergency Department: Payer: Medicare Other

## 2022-12-11 ENCOUNTER — Other Ambulatory Visit: Payer: Self-pay

## 2022-12-11 DIAGNOSIS — Z8546 Personal history of malignant neoplasm of prostate: Secondary | ICD-10-CM | POA: Diagnosis not present

## 2022-12-11 DIAGNOSIS — J40 Bronchitis, not specified as acute or chronic: Secondary | ICD-10-CM | POA: Diagnosis not present

## 2022-12-11 DIAGNOSIS — I10 Essential (primary) hypertension: Secondary | ICD-10-CM | POA: Diagnosis not present

## 2022-12-11 DIAGNOSIS — F17200 Nicotine dependence, unspecified, uncomplicated: Secondary | ICD-10-CM | POA: Diagnosis not present

## 2022-12-11 DIAGNOSIS — Z20822 Contact with and (suspected) exposure to covid-19: Secondary | ICD-10-CM | POA: Insufficient documentation

## 2022-12-11 DIAGNOSIS — R0602 Shortness of breath: Secondary | ICD-10-CM | POA: Diagnosis present

## 2022-12-11 LAB — CBC WITH DIFFERENTIAL/PLATELET
Abs Immature Granulocytes: 0.02 10*3/uL (ref 0.00–0.07)
Basophils Absolute: 0.1 10*3/uL (ref 0.0–0.1)
Basophils Relative: 1 %
Eosinophils Absolute: 0.3 10*3/uL (ref 0.0–0.5)
Eosinophils Relative: 5 %
HCT: 46.3 % (ref 39.0–52.0)
Hemoglobin: 14.9 g/dL (ref 13.0–17.0)
Immature Granulocytes: 0 %
Lymphocytes Relative: 27 %
Lymphs Abs: 1.6 10*3/uL (ref 0.7–4.0)
MCH: 29.4 pg (ref 26.0–34.0)
MCHC: 32.2 g/dL (ref 30.0–36.0)
MCV: 91.5 fL (ref 80.0–100.0)
Monocytes Absolute: 0.4 10*3/uL (ref 0.1–1.0)
Monocytes Relative: 7 %
Neutro Abs: 3.3 10*3/uL (ref 1.7–7.7)
Neutrophils Relative %: 60 %
Platelets: 169 10*3/uL (ref 150–400)
RBC: 5.06 MIL/uL (ref 4.22–5.81)
RDW: 14.9 % (ref 11.5–15.5)
WBC: 5.7 10*3/uL (ref 4.0–10.5)
nRBC: 0 % (ref 0.0–0.2)

## 2022-12-11 LAB — RESP PANEL BY RT-PCR (RSV, FLU A&B, COVID)  RVPGX2
Influenza A by PCR: NEGATIVE
Influenza B by PCR: NEGATIVE
Resp Syncytial Virus by PCR: NEGATIVE
SARS Coronavirus 2 by RT PCR: NEGATIVE

## 2022-12-11 LAB — BASIC METABOLIC PANEL
Anion gap: 10 (ref 5–15)
BUN: 24 mg/dL — ABNORMAL HIGH (ref 8–23)
CO2: 22 mmol/L (ref 22–32)
Calcium: 8.4 mg/dL — ABNORMAL LOW (ref 8.9–10.3)
Chloride: 109 mmol/L (ref 98–111)
Creatinine, Ser: 1.09 mg/dL (ref 0.61–1.24)
GFR, Estimated: >60 mL/min (ref >60)
Glucose, Bld: 146 mg/dL — ABNORMAL HIGH (ref 70–99)
Potassium: 3.9 mmol/L (ref 3.5–5.1)
Sodium: 141 mmol/L (ref 135–145)

## 2022-12-11 LAB — TROPONIN I (HIGH SENSITIVITY)
Troponin I (High Sensitivity): 5 ng/L (ref <18)
Troponin I (High Sensitivity): 5 ng/L (ref <18)

## 2022-12-11 LAB — D-DIMER, QUANTITATIVE: D-Dimer, Quant: 1.33 ug/mL-FEU — ABNORMAL HIGH (ref 0.00–0.50)

## 2022-12-11 MED ORDER — IPRATROPIUM-ALBUTEROL 0.5-2.5 (3) MG/3ML IN SOLN
RESPIRATORY_TRACT | Status: AC
  Administered 2022-12-11: 3 mL via RESPIRATORY_TRACT
  Filled 2022-12-11: qty 3

## 2022-12-11 MED ORDER — IPRATROPIUM-ALBUTEROL 0.5-2.5 (3) MG/3ML IN SOLN
RESPIRATORY_TRACT | Status: AC
  Filled 2022-12-11: qty 3

## 2022-12-11 MED ORDER — IPRATROPIUM-ALBUTEROL 0.5-2.5 (3) MG/3ML IN SOLN
3.0000 mL | Freq: Once | RESPIRATORY_TRACT | Status: AC
  Administered 2022-12-11: 3 mL via RESPIRATORY_TRACT
  Filled 2022-12-11: qty 3

## 2022-12-11 MED ORDER — IPRATROPIUM-ALBUTEROL 0.5-2.5 (3) MG/3ML IN SOLN
3.0000 mL | Freq: Once | RESPIRATORY_TRACT | Status: AC
  Administered 2022-12-11: 3 mL via RESPIRATORY_TRACT

## 2022-12-11 MED ORDER — IPRATROPIUM-ALBUTEROL 0.5-2.5 (3) MG/3ML IN SOLN
3.0000 mL | Freq: Once | RESPIRATORY_TRACT | Status: DC
  Filled 2022-12-11: qty 3

## 2022-12-11 MED ORDER — PREDNISONE 10 MG PO TABS: 10.0000 mg | ORAL_TABLET | Freq: Every day | ORAL | 0 refills | Status: DC

## 2022-12-11 MED ORDER — METHYLPREDNISOLONE SODIUM SUCC 125 MG IJ SOLR
125.0000 mg | Freq: Once | INTRAMUSCULAR | Status: AC
  Administered 2022-12-11: 125 mg via INTRAVENOUS
  Filled 2022-12-11: qty 2

## 2022-12-11 MED ORDER — IOHEXOL 350 MG/ML SOLN
75.0000 mL | Freq: Once | INTRAVENOUS | Status: AC | PRN
  Administered 2022-12-11: 75 mL via INTRAVENOUS

## 2022-12-11 NOTE — ED Triage Notes (Signed)
Patient was out with his family for a father's day lunch when he became short of breath and didn't have his inhaler on him; Upon arrival here, he has a cough and was put on O2 (does not wear O2 at home); Duoneb given for audible wheezes

## 2022-12-11 NOTE — ED Notes (Signed)
First Nurse Note: Pt to ED via [facility] from home for wheezing. Per EMS. Pt has hx/o same. Pt SpO2 was 90% on room air. Pt was given 1 duo neb and 125 mg of Sol-u-medrol

## 2022-12-11 NOTE — ED Provider Notes (Signed)
[facility] Provider Note    Event Date/Time   First MD Initiated Contact with Patient 12/11/22 1928     (approximate)   History   Shortness of Breath (Patient was out with his family for a father's day lunch when he became short of breath and didn't have his inhaler on him; Upon arrival here, he has a cough and was put on O2 (does not wear O2 at home); Duoneb given for audible wheezes)   HPI  Trevor Carlson is a 88 y.o. male with a history of hypertension as well as prostate cancer presents to the ER for evaluation shortness of breath.  States he has been diagnosed with bronchitis in the past does have a rescue inhaler.  States that sometimes he gets more short of breath particularly with very hot weather and was out with family for Father's Day felt worsening shortness of breath.  Was placed on supplemental oxygen upon arrival to the ER.  Does not wear home oxygen.  Does feel wheezing and dry cough.     Physical Exam   Triage Vital Signs: ED Triage Vitals [12/11/22 1614]  Enc Vitals Group     BP 131/77     Pulse Rate 98     Resp 18     Temp 98.2 F (36.8 C)     Temp Source Oral     SpO2 97 %     Weight      Height      Head Circumference      Peak Flow      Pain Score 0     Pain Loc      Pain Edu?      Excl. in GC?     Most recent vital signs: Vitals:   12/11/22 2030 12/11/22 2045  BP: (!) 149/85   Pulse: 100 99  Resp:    Temp:    SpO2:       Constitutional: Alert  Eyes: Conjunctivae are normal.  Head: Atraumatic. Nose: No congestion/rhinnorhea. Mouth/Throat: Mucous membranes are moist.   Neck: Painless ROM.  Cardiovascular:   Good peripheral circulation. Respiratory: No respiratory distress but does have diminished breath sounds throughout with faint expiratory wheeze. Gastrointestinal: Soft and nontender.  Musculoskeletal:  no deformity Neurologic:  MAE spontaneously. No gross focal neurologic deficits are appreciated.  Skin:   Skin is warm, dry and intact. No rash noted. Psychiatric: Mood and affect are normal. Speech and behavior are normal.    ED Results / Procedures / Treatments   Labs (all labs ordered are listed, but only abnormal results are displayed) Labs Reviewed  BASIC METABOLIC PANEL - Abnormal; Notable for the following components:      Result Value   Glucose, Bld 146 (*)    BUN 24 (*)    Calcium 8.4 (*)    All other components within normal limits  D-DIMER, QUANTITATIVE - Abnormal; Notable for the following components:   D-Dimer, Quant 1.33 (*)    All other components within normal limits  RESP PANEL BY RT-PCR (RSV, FLU A&B, COVID)  [id number]  CBC WITH DIFFERENTIAL/PLATELET  TROPONIN I (HIGH SENSITIVITY)  TROPONIN I (HIGH SENSITIVITY)     EKG  ED ECG REPORT I, Willy Eddy, the attending physician, personally viewed and interpreted this ECG.   Date: 12/11/2022  EKG Time: 16:22  Rate: 100  Rhythm: sinus  Axis: normal  Intervals: normal  ST&T Change: no stemi, no depressions    RADIOLOGY Please see  ED Course for my review and interpretation.  I personally reviewed all radiographic images ordered to evaluate for the above acute complaints and reviewed radiology reports and findings.  These findings were personally discussed with the patient.  Please see medical record for radiology report.    PROCEDURES:  Critical Care performed: No  Procedures   MEDICATIONS ORDERED IN ED: Medications  ipratropium-albuterol (DUONEB) 0.5-2.5 (3) MG/3ML nebulizer solution 3 mL (3 mLs Nebulization Given 12/11/22 1631)  ipratropium-albuterol (DUONEB) 0.5-2.5 (3) MG/3ML nebulizer solution 3 mL (3 mLs Nebulization Given 12/11/22 2016)  methylPREDNISolone sodium succinate (SOLU-MEDROL) 125 mg/2 mL injection 125 mg (125 mg Intravenous Given 12/11/22 2008)  iohexol (OMNIPAQUE) 350 MG/ML injection 75 mL (75 mLs Intravenous Contrast Given 12/11/22 2055)     IMPRESSION / MDM / ASSESSMENT AND PLAN  / ED COURSE  I reviewed the triage vital signs and the nursing notes.                              Differential diagnosis includes, but is not limited to, Asthma, copd, CHF, pna, ptx, malignancy, Pe, anemia  Patient presenting to the ER for evaluation of symptoms as described above.  Based on symptoms, risk factors and considered above differential, this presenting complaint could reflect a potentially life-threatening illness therefore the patient will be placed on continuous pulse oximetry and telemetry for monitoring.  Laboratory evaluation will be sent to evaluate for the above complaints.  Chest x-ray on my review and interpretation without evidence of consolidation or edema.  Will give nebulizer as well as steroids.  Will send D-dimer to further stratify.    Clinical Course as of 12/11/22 2144  Wynelle Link Dec 11, 2022  2019 Patient is satting well on room air currently will continue observe. [PR]  2113 CTA on my review and interpretation without evidence of PE.  Findings are consistent with bronchitis. [PR]  2143 Patient reassessed.  He is able to ambulate with steady gait no hypoxia.  Feels significantly improved in no acute distress.  Does have good air movement throughout course wheezing consistent with bronchitis and probably underlying COPD given his history of smoking.  Will give referral to pulmonology clinic.  We discussed option for observation the hospital but given his well appearance not requiring any oxygen I think that outpatient follow-up is reasonable.  He does seem reliable and has close family available.  Agrees to return if his symptoms worsen at any point. [PR]    Clinical Course User Index [PR] Willy Eddy, MD     FINAL CLINICAL IMPRESSION(S) / ED DIAGNOSES   Final diagnoses:  Bronchitis     Rx / DC Orders   ED Discharge Orders          Ordered    predniSONE (DELTASONE) 10 MG tablet  Daily        12/11/22 2143             Note:  This document was  prepared using Dragon voice recognition software and may include unintentional dictation errors.    Willy Eddy, MD 12/11/22 2144

## 2022-12-11 NOTE — ED Notes (Signed)
Pt ambulated in room with steady gait. Spo2 remained above 93% and patient denied shortness of breath during ambulation.

## 2023-01-17 ENCOUNTER — Ambulatory Visit: Payer: Medicare Other | Admitting: Dermatology

## 2023-01-17 VITALS — BP 138/89 | HR 81

## 2023-01-17 DIAGNOSIS — L814 Other melanin hyperpigmentation: Secondary | ICD-10-CM

## 2023-01-17 DIAGNOSIS — L729 Follicular cyst of the skin and subcutaneous tissue, unspecified: Secondary | ICD-10-CM

## 2023-01-17 DIAGNOSIS — L720 Epidermal cyst: Secondary | ICD-10-CM

## 2023-01-17 DIAGNOSIS — Z85828 Personal history of other malignant neoplasm of skin: Secondary | ICD-10-CM

## 2023-01-17 DIAGNOSIS — L821 Other seborrheic keratosis: Secondary | ICD-10-CM | POA: Diagnosis not present

## 2023-01-17 DIAGNOSIS — L578 Other skin changes due to chronic exposure to nonionizing radiation: Secondary | ICD-10-CM

## 2023-01-17 DIAGNOSIS — Z872 Personal history of diseases of the skin and subcutaneous tissue: Secondary | ICD-10-CM

## 2023-01-17 DIAGNOSIS — W908XXA Exposure to other nonionizing radiation, initial encounter: Secondary | ICD-10-CM

## 2023-01-17 NOTE — Patient Instructions (Addendum)
 Pre-Operative Instructions  You are scheduled for a surgical procedure at [facility]. We recommend you read the following instructions. If you have any questions or concerns, please call the office at [phone].  Shower and wash the entire body with soap and water the day of your surgery paying special attention to cleansing at and around the planned surgery site.  Avoid aspirin or aspirin containing products at least fourteen (14) days prior to your surgical procedure and for at least one week (7 Days) after your surgical procedure. If you take aspirin on a regular basis for heart disease or history of stroke or for any other reason, we may recommend you continue taking aspirin but please notify us if you take this on a regular basis. Aspirin can cause more bleeding to occur during surgery as well as prolonged bleeding and bruising after surgery.   Avoid other nonsteroidal pain medications at least one week prior to surgery and at least one week prior to your surgery. These include medications such as Ibuprofen (Motrin, Advil and Nuprin), Naprosyn, Voltaren, Relafen, etc. If medications are used for therapeutic reasons, please inform us as they can cause increased bleeding or prolonged bleeding during and bruising after surgical procedures.   Please advise us if you are taking any "blood thinner" medications such as Coumadin or Dipyridamole or Plavix or similar medications. These cause increased bleeding and prolonged bleeding during procedures and bruising after surgical procedures. We may have to consider discontinuing these medications briefly prior to and shortly after your surgery if safe to do so.   Please inform us of all medications you are currently taking. All medications that are taken regularly should be taken the day of surgery as you always do. Nevertheless, we need to be informed of what medications you are taking prior to surgery to know whether they will affect the  procedure or cause any complications.   Please inform us of any medication allergies. Also inform us of whether you have allergies to Latex or rubber products or whether you have had any adverse reaction to Lidocaine or Epinephrine.  Please inform us of any prosthetic or artificial body parts such as artificial heart valve, joint replacements, etc., or similar condition that might require preoperative antibiotics.   We recommend avoidance of alcohol at least two weeks prior to surgery and continued avoidance for at least two weeks after surgery.   We recommend discontinuation of tobacco smoking at least two weeks prior to surgery and continued abstinence for at least two weeks after surgery.  Do not plan strenuous exercise, strenuous work or strenuous lifting for approximately four weeks after your surgery.   We request if you are unable to make your scheduled surgical appointment, please call us at least a week in advance or as soon as you are aware of a problem so that we can cancel or reschedule the appointment.   You MAY TAKE TYLENOL (acetaminophen) for pain as it is not a blood thinner.   PLEASE PLAN TO BE IN TOWN FOR TWO WEEKS FOLLOWING SURGERY, THIS IS IMPORTANT SO YOU CAN BE CHECKED FOR DRESSING CHANGES, SUTURE REMOVAL AND TO MONITOR FOR POSSIBLE COMPLICATIONS.    Due to recent changes in healthcare laws, you may see results of your pathology and/or laboratory studies on MyChart before the doctors have had a chance to review them. We understand that in some cases there may be results that are confusing or concerning to you. Please understand that not all results are   received at the same time and often the doctors may need to interpret multiple results in order to provide you with the best plan of care or course of treatment. Therefore, we ask that you please give us 2 business days to thoroughly review all your results before contacting the office for clarification. Should we see a  critical lab result, you will be contacted sooner.   If You Need Anything After Your Visit  If you have any questions or concerns for your doctor, please call our main line at [phone] and press option 4 to reach your doctor's medical assistant. If no one answers, please leave a voicemail as directed and we will return your call as soon as possible. Messages left after 4 pm will be answered the following business day.   You may also send us a message via MyChart. We typically respond to MyChart messages within 1-2 business days.  For prescription refills, please ask your pharmacy to contact our office. Our fax number is [phone].  If you have an urgent issue when the clinic is closed that cannot wait until the next business day, you can page your doctor at the number below.    Please note that while we do our best to be available for urgent issues outside of office hours, we are not available 24/7.   If you have an urgent issue and are unable to reach us, you may choose to seek medical care at your doctor's office, retail clinic, urgent care center, or emergency room.  If you have a medical emergency, please immediately call 911 or go to the emergency department.  Pager Numbers  - Dr. Kowalski: [phone]  - Dr. Moye: [phone]  - Dr. Stewart: [phone]  In the event of inclement weather, please call our main line at [phone] for an update on the status of any delays or closures.  Dermatology Medication Tips: Please keep the boxes that topical medications come in in order to help keep track of the instructions about where and how to use these. Pharmacies typically print the medication instructions only on the boxes and not directly on the medication tubes.   If your medication is too expensive, please contact our office at [phone] option 4 or send us a message through MyChart.   We are unable to tell what your co-pay for medications will be in advance as  this is different depending on your insurance coverage. However, we may be able to find a substitute medication at lower cost or fill out paperwork to get insurance to cover a needed medication.   If a prior authorization is required to get your medication covered by your insurance company, please allow us 1-2 business days to complete this process.  Drug prices often vary depending on where the prescription is filled and some pharmacies may offer cheaper prices.  The website www.goodrx.com contains coupons for medications through different pharmacies. The prices here do not account for what the cost may be with help from insurance (it may be cheaper with your insurance), but the website can give you the price if you did not use any insurance.  - You can print the associated coupon and take it with your prescription to the pharmacy.  - You may also stop by our office during regular business hours and pick up a GoodRx coupon card.  - If you need your prescription sent electronically to a different pharmacy, notify our office through [facility] MyChart or by phone at [phone] option 4.       Si Usted Necesita Algo Despus de Su Visita  Tambin puede enviarnos un mensaje a travs de MyChart. Por lo general respondemos a los mensajes de MyChart en el transcurso de 1 a 2 das hbiles.  Para renovar recetas, por favor pida a su farmacia que se ponga en contacto con nuestra oficina. Nuestro nmero de fax es el [phone].  Si tiene un asunto urgente cuando la clnica est cerrada y que no puede esperar hasta el siguiente da hbil, puede llamar/localizar a su doctor(a) al nmero que aparece a continuacin.   Por favor, tenga en cuenta que aunque hacemos todo lo posible para estar disponibles para asuntos urgentes fuera del horario de oficina, no estamos disponibles las 24 horas del da, los 7 das de la semana.   Si tiene un problema urgente y no puede comunicarse con nosotros, puede optar por  buscar atencin mdica  en el consultorio de su doctor(a), en una clnica privada, en un centro de atencin urgente o en una sala de emergencias.  Si tiene una emergencia mdica, por favor llame inmediatamente al 911 o vaya a la sala de emergencias.  Nmeros de bper  - Dr. Kowalski: [phone]  - Dra. Moye: [phone]  - Dra. Stewart: [phone]  En caso de inclemencias del tiempo, por favor llame a nuestra lnea principal al [phone] para una actualizacin sobre el estado de cualquier retraso o cierre.  Consejos para la medicacin en dermatologa: Por favor, guarde las cajas en las que vienen los medicamentos de uso tpico para ayudarle a seguir las instrucciones sobre dnde y cmo usarlos. Las farmacias generalmente imprimen las instrucciones del medicamento slo en las cajas y no directamente en los tubos del medicamento.   Si su medicamento es muy caro, por favor, pngase en contacto con nuestra oficina llamando al [phone] y presione la opcin 4 o envenos un mensaje a travs de MyChart.   No podemos decirle cul ser su copago por los medicamentos por adelantado ya que esto es diferente dependiendo de la cobertura de su seguro. Sin embargo, es posible que podamos encontrar un medicamento sustituto a menor costo o llenar un formulario para que el seguro cubra el medicamento que se considera necesario.   Si se requiere una autorizacin previa para que su compaa de seguros cubra su medicamento, por favor permtanos de 1 a 2 das hbiles para completar este proceso.  Los precios de los medicamentos varan con frecuencia dependiendo del lugar de dnde se surte la receta y alguna farmacias pueden ofrecer precios ms baratos.  El sitio web www.goodrx.com tiene cupones para medicamentos de diferentes farmacias. Los precios aqu no tienen en cuenta lo que podra costar con la ayuda del seguro (puede ser ms barato con su seguro), pero el sitio web puede darle el precio si no  utiliz ningn seguro.  - Puede imprimir el cupn correspondiente y llevarlo con su receta a la farmacia.  - Tambin puede pasar por nuestra oficina durante el horario de atencin regular y recoger una tarjeta de cupones de GoodRx.  - Si necesita que su receta se enve electrnicamente a una farmacia diferente, informe a nuestra oficina a travs de MyChart de [facility] o por telfono llamando al [phone] y presione la opcin 4.  

## 2023-01-17 NOTE — Progress Notes (Signed)
Follow-Up Visit   Subjective  Trevor Carlson is a 88 y.o. male who presents for the following: f/up Actinic keratosis.  The patient has spots, moles and lesions to be evaluated, some may be new or changing and the patient may have concern these could be cancer.   The following portions of the chart were reviewed this encounter and updated as appropriate: medications, allergies, medical history  Review of Systems:  No other skin or systemic complaints except as noted in HPI or Assessment and Plan.  Objective  Well appearing patient in no apparent distress; mood and affect are within normal limits.  A focused examination was performed of the following areas:scalp,face,back,chest,arms    Relevant exam findings are noted in the Assessment and Plan.  face,nose,ears Clear skin     Assessment & Plan   History of actinic keratoses face,nose,ears  - site(s) of PreCancerous Actinic Keratosis clear today. - these may recur and new lesions may form requiring treatment to prevent transformation into skin cancer - observe for new or changing spots and contact [facility] for appointment if occur - photoprotection with sun protective clothing; sunglasses and broad spectrum sunscreen with SPF of at least 30 + and frequent self skin exams recommended - yearly exams by a dermatologist recommended for persons with history of PreCancerous Actinic Keratoses    LENTIGINES Exam: scattered tan macules Due to sun exposure Treatment Plan: Benign-appearing, observe. Recommend daily broad spectrum sunscreen SPF 30+ to sun-exposed areas, reapply every 2 hours as needed.  Call for any changes   SEBORRHEIC KERATOSIS - Stuck-on, waxy, tan-brown papules and/or plaques  - Benign-appearing - Discussed benign etiology and prognosis. - Observe - Call for any changes  ACTINIC DAMAGE - chronic, secondary to cumulative UV radiation exposure/sun exposure over time - diffuse scaly erythematous  macules with underlying dyspigmentation - Recommend daily broad spectrum sunscreen SPF 30+ to sun-exposed areas, reapply every 2 hours as needed.  - Recommend staying in the shade or wearing long sleeves, sun glasses (UVA+UVB protection) and wide brim hats (4-inch brim around the entire circumference of the hat). - Call for new or changing lesions.   Epidermal cyst R spinal upper back Exam: 1.5 cm  Firm SQ nodule   Benign-appearing. Exam most consistent with an epidermal inclusion cyst. Discussed that a cyst is a benign growth that can grow over time and sometimes get irritated or inflamed. Recommend observation if it is not bothersome. Discussed option of surgical excision to remove it if it is growing, symptomatic, or other changes noted. Please call for new or changing lesions so they can be evaluated.  Cyst with symptoms and/or recent change.  Discussed surgical excision to remove, including resulting scar and possible recurrence.  Patient will schedule for surgery. Pre-op information given.    HISTORY OF BASAL CELL CARCINOMA OF THE SKIN - No evidence of recurrence today - Recommend regular full body skin exams - Recommend daily broad spectrum sunscreen SPF 30+ to sun-exposed areas, reapply every 2 hours as needed.  - Call if any new or changing lesions are noted between office visits   HISTORY OF SQUAMOUS CELL CARCINOMA OF THE SKIN - No evidence of recurrence today - Recommend regular full body skin exams - Recommend daily broad spectrum sunscreen SPF 30+ to sun-exposed areas, reapply every 2 hours as needed.  - Call if any new or changing lesions are noted between office visits   Return for cyst surgery and 6 months for TBSE, hx of SCC,  hx of BCC.  I, Angelique Holm, CMA, am acting as scribe for Willeen Niece, MD .   Documentation: I have reviewed the above documentation for accuracy and completeness, and I agree with the above.  Willeen Niece, MD

## 2023-03-11 IMAGING — DX DG CHEST 1V PORT
1 series · 1 of 1 positions shown · non-contrast
Comparison: Chest radiograph dated 09/15/2016.

CLINICAL DATA: Cough.

EXAM:
PORTABLE CHEST 1 VIEW

[chest ap]
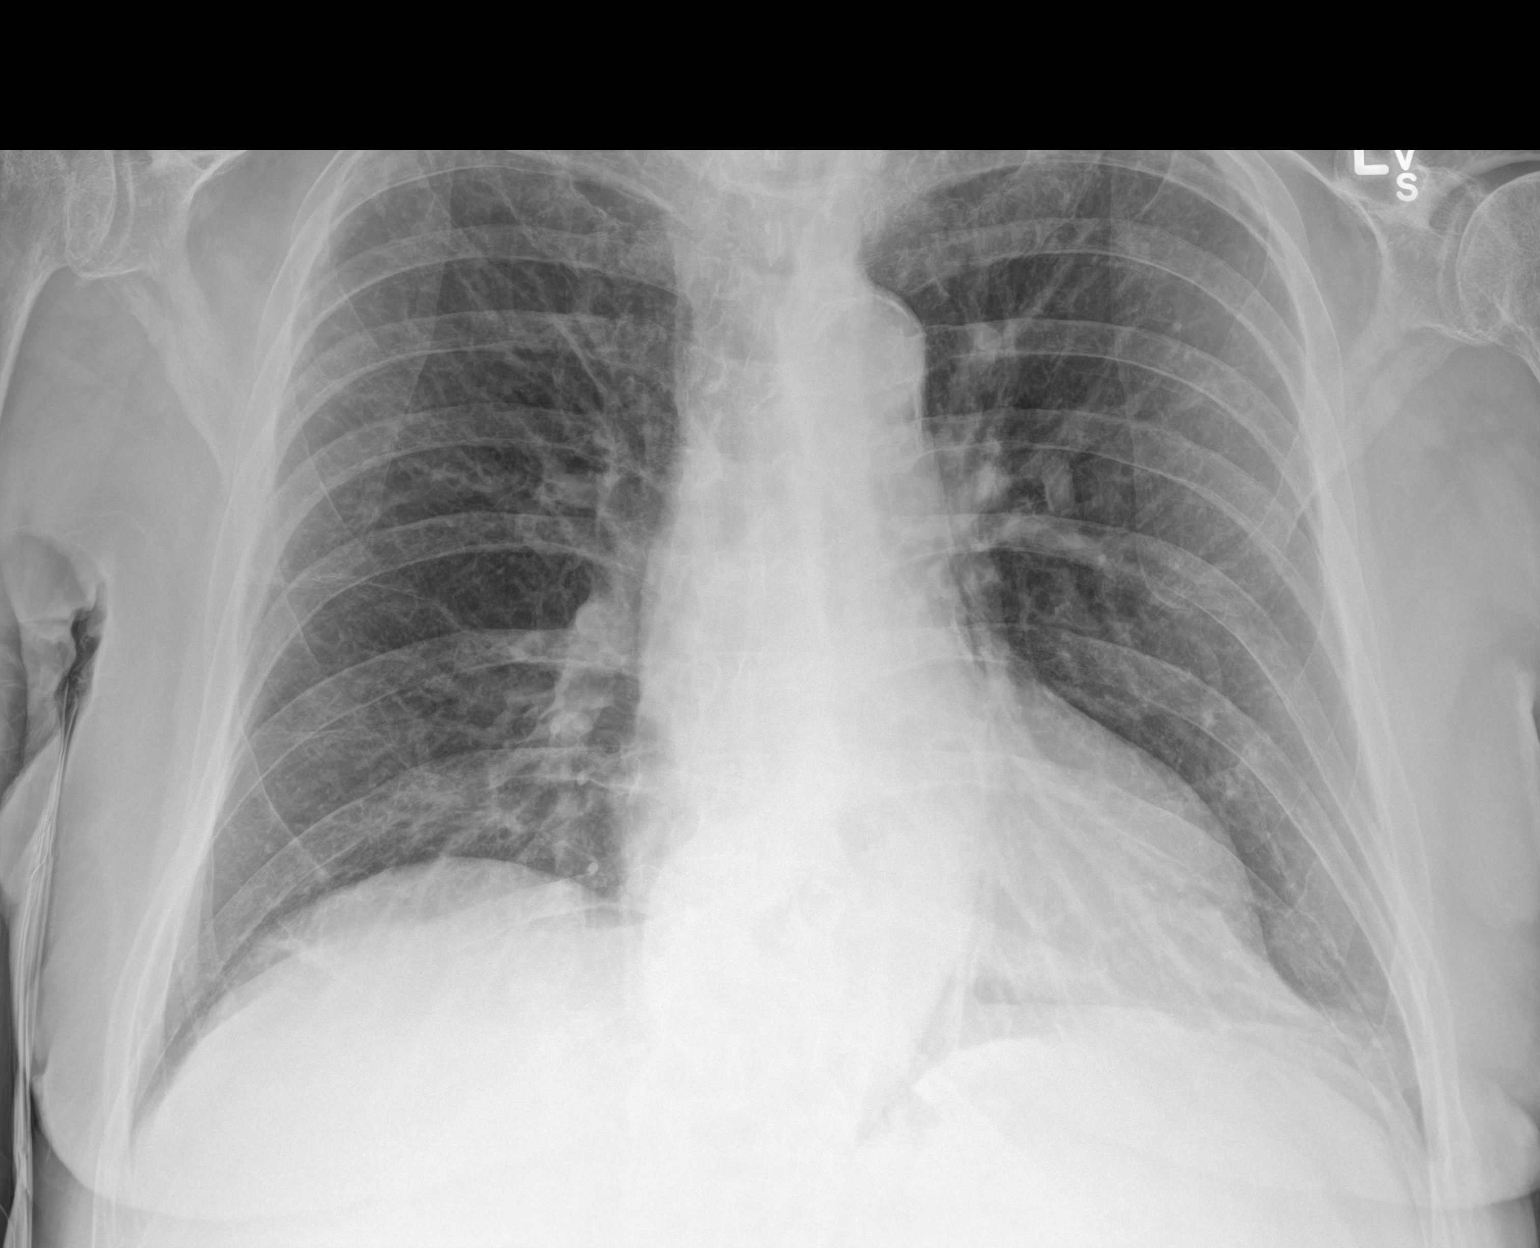

[1 of 1 positions shown; findings below may reference images not displayed]

FINDINGS: Minimal left lung base atelectasis. No focal consolidation, pleural
effusion, or pneumothorax. The cardiac silhouette is within limits.
Atherosclerotic calcification of the aorta. Small hiatal hernia. No
acute osseous pathology.
IMPRESSION: No active disease.

## 2023-04-17 ENCOUNTER — Encounter: Payer: Self-pay | Admitting: Dermatology

## 2023-04-17 ENCOUNTER — Ambulatory Visit: Payer: Medicare Other | Admitting: Dermatology

## 2023-04-17 VITALS — BP 115/67

## 2023-04-17 DIAGNOSIS — D492 Neoplasm of unspecified behavior of bone, soft tissue, and skin: Secondary | ICD-10-CM

## 2023-04-17 DIAGNOSIS — L720 Epidermal cyst: Secondary | ICD-10-CM

## 2023-04-17 NOTE — Patient Instructions (Signed)
Wound Care Instructions  On the day following your surgery, you should begin doing daily dressing changes: Remove the old dressing and discard it. Cleanse the wound gently with tap water. This may be done in the shower or by placing a wet gauze pad directly on the wound and letting it soak for several minutes. It is important to gently remove any dried blood from the wound in order to encourage healing. This may be done by gently rolling a moistened Q-tip on the dried blood. Do not pick at the wound. If the wound should start to bleed, continue cleaning the wound, then place a moist gauze pad on the wound and hold pressure for a few minutes.  Make sure you then dry the skin surrounding the wound completely or the tape will not stick to the skin. Do not use cotton balls on the wound. After the wound is clean and dry, apply the ointment gently with a Q-tip. Cut a non-stick pad to fit the size of the wound. Lay the pad flush to the wound. If the wound is draining, you may want to reinforce it with a small amount of gauze on top of the non-stick pad for a little added compression to the area. Use the tape to seal the area completely. Select from the following with respect to your individual situation: If your wound has been stitched closed: continue the above steps 1-8 at least daily until your sutures are removed. If your wound has been left open to heal: continue steps 1-8 at least daily for the first 3-4 weeks. We would like for you to take a few extra precautions for at least the next week. Sleep with your head elevated on pillows if our wound is on your head. Do not bend over or lift heavy items to reduce the chance of elevated blood pressure to the wound Do not participate in particularly strenuous activities.   Below is a list of dressing supplies you might need.  Cotton-tipped applicators - Q-tips Gauze pads (2x2 and/or 4x4) - All-Purpose Sponges Non-stick dressing material - Telfa Tape -  Paper or Hypafix New and clean tube of petroleum jelly - Vaseline    Comments on Post-Operative Period Slight swelling and redness often appear around the wound. This is normal and will disappear within several days following the surgery. The healing wound will drain a brownish-red-yellow discharge during healing. This is a normal phase of wound healing. As the wound begins to heal, the drainage may increase in amount. Again, this drainage is normal. Notify us if the drainage becomes persistently bloody, excessively swollen, or intensely painful or develops a foul odor or red streaks.  If you should experience mild discomfort during the healing phase, you may take an aspirin-free medication such as Tylenol (acetaminophen). Notify us if the discomfort is severe or persistent. Avoid alcoholic beverages when taking pain medicine.  In Case of Wound Hemorrhage A wound hemorrhage is when the bandage suddenly becomes soaked with bright red blood and flows profusely. If this happens, sit down or lie down with your head elevated. If the wound has a dressing on it, do not remove the dressing. Apply pressure to the existing gauze. If the wound is not covered, use a gauze pad to apply pressure and continue applying the pressure for 20 minutes without peeking. DO NOT COVER THE WOUND WITH A LARGE TOWEL OR WASH CLOTH. Release your hand from the wound site but do not remove the dressing. If the bleeding has stopped,  gently clean around the wound. Leave the dressing in place for 24 hours if possible. This wait time allows the blood vessels to close off so that you do not spark a new round of bleeding by disrupting the newly clotted blood vessels with an immediate dressing change. If the bleeding does not subside, continue to hold pressure. If matters are out of your control, contact an After Hours clinic or go to the Emergency Room.  Due to recent changes in healthcare laws, you may see results of your pathology and/or  laboratory studies on MyChart before the doctors have had a chance to review them. We understand that in some cases there may be results that are confusing or concerning to you. Please understand that not all results are received at the same time and often the doctors may need to interpret multiple results in order to provide you with the best plan of care or course of treatment. Therefore, we ask that you please give [place] 2 business days to thoroughly review all your results before contacting the office for clarification. Should we see a critical lab result, you will be contacted sooner.   If You Need Anything After Your Visit  If you have any questions or concerns for your doctor, please call our main line at [phone] and press option 4 to reach your doctor's medical assistant. If no one answers, please leave a voicemail as directed and we will return your call as soon as possible. Messages left after 4 pm will be answered the following business day.   You may also send [place] a message via MyChart. We typically respond to MyChart messages within 1-2 business days.  For prescription refills, please ask your pharmacy to contact our office. Our fax number is [phone].  If you have an urgent issue when the clinic is closed that cannot wait until the next business day, you can page your doctor at the number below.    Please note that while we do our best to be available for urgent issues outside of office hours, we are not available 24/7.   If you have an urgent issue and are unable to reach [place], you may choose to seek medical care at your doctor's office, retail clinic, urgent care center, or emergency room.  If you have a medical emergency, please immediately call 911 or go to the emergency department.  Pager Numbers  - Dr. Gwen Pounds: [phone]  - Dr. Roseanne Reno: [phone]  - Dr. Katrinka Blazing: [phone]   In the event of inclement weather, please call our main line at [phone] for an  update on the status of any delays or closures.  Dermatology Medication Tips: Please keep the boxes that topical medications come in in order to help keep track of the instructions about where and how to use these. Pharmacies typically print the medication instructions only on the boxes and not directly on the medication tubes.   If your medication is too expensive, please contact our office at [phone] option 4 or send [place] a message through MyChart.   We are unable to tell what your co-pay for medications will be in advance as this is different depending on your insurance coverage. However, we may be able to find a substitute medication at lower cost or fill out paperwork to get insurance to cover a needed medication.   If a prior authorization is required to get your medication covered by your insurance company, please allow [place] 1-2 business days to complete this process.  Drug prices often vary depending on where the prescription is filled and some pharmacies may offer cheaper prices.  The website www.goodrx.com contains coupons for medications through different pharmacies. The prices here do not account for what the cost may be with help from insurance (it may be cheaper with your insurance), but the website can give you the price if you did not use any insurance.  - You can print the associated coupon and take it with your prescription to the pharmacy.  - You may also stop by our office during regular business hours and pick up a GoodRx coupon card.  - If you need your prescription sent electronically to a different pharmacy, notify our office through [facility] or by phone at [phone] option 4.     Si Usted Necesita Algo Despus de Su Visita  Tambin puede enviarnos un mensaje a travs de Clinical cytogeneticist. Por lo general respondemos a los mensajes de MyChart en el transcurso de 1 a 2 das hbiles.  Para renovar recetas, por favor pida a su farmacia que se ponga en contacto con  nuestra oficina. Annie Sable de fax es [place] [phone].  Si tiene un asunto urgente cuando la clnica est cerrada y que no puede esperar hasta el siguiente da hbil, puede llamar/localizar a su doctor(a) al nmero que aparece a continuacin.   Por favor, tenga en cuenta que aunque hacemos todo lo posible para estar disponibles para asuntos urgentes fuera del horario de [place], no estamos disponibles las 24 horas del da, los 7 [address]  [address] de la [place].   Si tiene un problema urgente y no puede comunicarse con nosotros, puede optar por buscar atencin mdica  en el consultorio de su doctor(a), en una clnica privada, en un centro de atencin urgente o en una sala de emergencias.  Si tiene Engineer, drilling, por favor llame inmediatamente al 911 o vaya a la sala de emergencias.  Nmeros de bper  - Dr. Gwen Pounds: [phone]  - Dra. Roseanne Reno: [phone]  - Dr. Katrinka Blazing: [phone]   En caso de inclemencias del tiempo, por favor llame a Lacy Duverney principal al [phone] para una actualizacin sobre el [place] de cualquier retraso o cierre.  Consejos para la medicacin en dermatologa: Por favor, guarde las cajas en las que vienen los medicamentos de uso tpico para ayudarle a seguir las instrucciones sobre dnde y cmo usarlos. Las farmacias generalmente imprimen las instrucciones del medicamento slo en las cajas y no directamente en los tubos del [place].   Si su medicamento es muy caro, por favor, pngase en contacto con Rolm Gala llamando al [phone] y presione la opcin 4 o envenos un mensaje a travs de Clinical cytogeneticist.   No podemos decirle cul ser su copago por los medicamentos por adelantado ya que esto es diferente dependiendo de la cobertura de su seguro. Sin embargo, es posible que podamos encontrar un medicamento sustituto a Audiological scientist un formulario para que el seguro cubra el medicamento que se considera necesario.   Si se requiere una autorizacin  previa para que su compaa de seguros [place] su medicamento, por favor permtanos de 1 a 2 das hbiles para completar [address]  Los precios de los medicamentos varan con frecuencia dependiendo del Environmental consultant de dnde se surte la receta y alguna farmacias pueden ofrecer precios ms baratos.  El sitio web www.goodrx.com tiene cupones para medicamentos de Health and safety inspector. Los precios aqu no tienen en cuenta lo que podra costar con la ayuda del seguro (puede ser  ms barato con su seguro), pero el sitio web puede darle el precio si no Visual merchandiser.  - Puede imprimir el cupn correspondiente y llevarlo con su receta a la farmacia.  - Tambin puede pasar por nuestra oficina durante el horario de atencin regular y Education officer, museum una tarjeta de cupones de GoodRx.  - Si necesita que su receta se enve electrnicamente a una farmacia diferente, informe a nuestra oficina a travs de MyChart de [place] o por telfono llamando al [phone] y presione la opcin 4.

## 2023-04-17 NOTE — Progress Notes (Signed)
   Follow-Up Visit   Subjective  Trevor Carlson is a 88 y.o. male who presents for the following: Cyst R spinal upper back, pt presents for excision  The following portions of the chart were reviewed this encounter and updated as appropriate: medications, allergies, medical history  Review of Systems:  No other skin or systemic complaints except as noted in HPI or Assessment and Plan.  Objective  Well appearing patient in no apparent distress; mood and affect are within normal limits.   A focused examination was performed of the following areas: back  Relevant exam findings are noted in the Assessment and Plan.  R spinal upper back SQ nodule 2.1cm    Assessment & Plan     Neoplasm of skin R spinal upper back  Skin excision  Lesion length (cm):  2.1 Lesion width (cm):  2.1 Margin per side (cm):  0.1 Total excision diameter (cm):  2.3 Informed consent: discussed and consent obtained   Timeout: patient name, date of birth, surgical site, and procedure verified   Procedure prep:  Patient was prepped and draped in usual sterile fashion Prep type:  Povidone-iodine Anesthesia: the lesion was anesthetized in a standard fashion   Anesthetic:  1% lidocaine w/ epinephrine 1-100,000 buffered w/ 8.4% NaHCO3 (9cc lido w/ epi, 6cc bupivicaine, Total of 15cc) Instrument used: #15 blade   Hemostasis achieved with: pressure   Outcome: patient tolerated procedure well with no complications    Skin repair Complexity:  Intermediate Final length (cm):  2.2 Reason for type of repair: reduce tension to allow closure, reduce the risk of dehiscence, infection, and necrosis, reduce subcutaneous dead space and avoid a hematoma and enhance both functionality and cosmetic results   Undermining: edges undermined   Subcutaneous layers (deep stitches):  Suture size:  3-0 Suture type: Vicryl (polyglactin 910)   Subcutaneous suture technique: Inverted Dermal. Fine/surface layer approximation (top  stitches):  Suture size:  3-0 Suture type: nylon   Stitches: simple interrupted   Suture removal (days):  7 Hemostasis achieved with: pressure Hemostasis achieved with comment:  Electrocautery Outcome: patient tolerated procedure well with no complications   Post-procedure details: sterile dressing applied and wound care instructions given   Dressing type: bandage and pressure dressing (Mupirocin)    Specimen 1 - Surgical pathology Differential Diagnosis: D48.5 Cyst vs other  Check Margins: No Cystic pap 2.1cm  Cyst vs other, excised today    Return in about 1 week (around 04/24/2023) for suture removal.  I, Sonya Hupman, RMA, am acting as scribe for Willeen Niece, MD .   Documentation: I have reviewed the above documentation for accuracy and completeness, and I agree with the above.  Willeen Niece, MD

## 2023-04-18 ENCOUNTER — Telehealth: Payer: Self-pay

## 2023-04-18 NOTE — Telephone Encounter (Signed)
Patient doing well after yesterdays surgery./sh 

## 2023-04-19 ENCOUNTER — Ambulatory Visit: Payer: Medicare Other

## 2023-04-19 DIAGNOSIS — Z23 Encounter for immunization: Secondary | ICD-10-CM

## 2023-04-19 DIAGNOSIS — Z719 Counseling, unspecified: Secondary | ICD-10-CM

## 2023-04-19 NOTE — Progress Notes (Signed)
Client seen in nurse clinic for flu vaccine. Vaccine administered and tolerated well. Darinda [place] Sherrilyn Rist, RN

## 2023-04-20 LAB — SURGICAL PATHOLOGY

## 2023-04-24 ENCOUNTER — Ambulatory Visit (INDEPENDENT_AMBULATORY_CARE_PROVIDER_SITE_OTHER): Payer: Medicare Other | Admitting: Dermatology

## 2023-04-24 DIAGNOSIS — L729 Follicular cyst of the skin and subcutaneous tissue, unspecified: Secondary | ICD-10-CM

## 2023-04-24 DIAGNOSIS — L720 Epidermal cyst: Secondary | ICD-10-CM

## 2023-04-24 NOTE — Progress Notes (Signed)
   Follow-Up Visit   Subjective  Trevor Carlson is a 88 y.o. male who presents for the following: Suture removal   Pathology showed EPIDERMAL INCLUSION CYST at right spinal upper back  The following portions of the chart were reviewed this encounter and updated as appropriate: medications, allergies, medical history  Review of Systems:  No other skin or systemic complaints except as noted in HPI or Assessment and Plan.  Objective  Well appearing patient in no apparent distress; mood and affect are within normal limits.  Areas Examined: Right spinal upper back Relevant physical exam findings are noted in the Assessment and Plan.    Assessment & Plan    Encounter for Removal of Sutures - Incision site is clean, dry and intact. - Wound cleansed, sutures removed, wound cleansed and steri strips applied.  - Discussed pathology results showing epidermal inclusion cyst  at right spinal upper back - Patient advised to keep steri-strips dry until they fall off. - Scars remodel for a full year. - Once steri-strips fall off, patient can apply over-the-counter silicone scar cream once to twice a day to help with scar remodeling if desired. - Patient advised to call with any concerns or if they notice any new or changing lesions.  Return for keep follow up as scheduled 08/22/23.  I, Asher Muir, CMA, am acting as scribe for Willeen Niece, MD.   Documentation: I have reviewed the above documentation for accuracy and completeness, and I agree with the above.  Willeen Niece, MD

## 2023-04-24 NOTE — Patient Instructions (Addendum)

## 2023-05-01 ENCOUNTER — Ambulatory Visit: Payer: Medicare Other

## 2023-05-01 DIAGNOSIS — Z719 Counseling, unspecified: Secondary | ICD-10-CM

## 2023-05-01 DIAGNOSIS — Z23 Encounter for immunization: Secondary | ICD-10-CM

## 2023-05-01 NOTE — Progress Notes (Signed)
In nurse clinic requesting covid vaccine.  VIS given.  See immunization flowsheet.   Comirnaty 12+ 2024-25 formula administered and tolerated well.  NCIR updated and copy given.  Declined to stay for post vaccine observation.    Cherlynn Polo, RN

## 2023-05-15 ENCOUNTER — Ambulatory Visit: Payer: Medicare Other

## 2023-05-15 DIAGNOSIS — Z23 Encounter for immunization: Secondary | ICD-10-CM

## 2023-05-15 DIAGNOSIS — Z719 Counseling, unspecified: Secondary | ICD-10-CM

## 2023-05-15 NOTE — Progress Notes (Signed)
In nurse clinic requesting Shingrix vaccine. Eligible, given VIS, administered and tolerated well. Given NCIR copy, explained and understood. M.Koda Defrank, LPN.

## 2023-07-11 ENCOUNTER — Ambulatory Visit: Payer: Medicare Other

## 2023-07-11 DIAGNOSIS — Z719 Counseling, unspecified: Secondary | ICD-10-CM

## 2023-07-11 DIAGNOSIS — Z23 Encounter for immunization: Secondary | ICD-10-CM | POA: Diagnosis not present

## 2023-07-11 NOTE — Progress Notes (Signed)
 In nurse clinic for #2 Shingrix vaccine.  VIS given.  Refer to immunization flowsheet.  Shingrix given IM and tolerated well.  NCIR updated and copy given to pt.    Cherlynn Polo, RN

## 2023-08-21 ENCOUNTER — Ambulatory Visit: Payer: Medicare Other | Admitting: Dermatology

## 2023-08-22 ENCOUNTER — Ambulatory Visit: Payer: Medicare Other | Admitting: Dermatology

## 2023-08-23 ENCOUNTER — Ambulatory Visit: Payer: Medicare Other | Admitting: Dermatology

## 2023-08-23 DIAGNOSIS — L821 Other seborrheic keratosis: Secondary | ICD-10-CM

## 2023-08-23 DIAGNOSIS — Z85828 Personal history of other malignant neoplasm of skin: Secondary | ICD-10-CM

## 2023-08-23 DIAGNOSIS — W098XXA Fall on or from other playground equipment, initial encounter: Secondary | ICD-10-CM | POA: Diagnosis not present

## 2023-08-23 DIAGNOSIS — S00511A Abrasion of lip, initial encounter: Secondary | ICD-10-CM

## 2023-08-23 DIAGNOSIS — L820 Inflamed seborrheic keratosis: Secondary | ICD-10-CM

## 2023-08-23 DIAGNOSIS — L814 Other melanin hyperpigmentation: Secondary | ICD-10-CM | POA: Diagnosis not present

## 2023-08-23 DIAGNOSIS — D1801 Hemangioma of skin and subcutaneous tissue: Secondary | ICD-10-CM

## 2023-08-23 DIAGNOSIS — L578 Other skin changes due to chronic exposure to nonionizing radiation: Secondary | ICD-10-CM

## 2023-08-23 DIAGNOSIS — Z1283 Encounter for screening for malignant neoplasm of skin: Secondary | ICD-10-CM | POA: Diagnosis not present

## 2023-08-23 DIAGNOSIS — D229 Melanocytic nevi, unspecified: Secondary | ICD-10-CM

## 2023-08-23 NOTE — Progress Notes (Signed)
 Follow-Up Visit   Subjective  Trevor Carlson is a 89 y.o. male who presents for the following: Skin Cancer Screening and Upper Body Skin Exam  The patient presents for Upper Body Skin Exam (UBSE) for skin cancer screening and mole check. The patient has spots, moles and lesions to be evaluated, some may be new or changing and the patient may have concern these could be cancer. He has a couple spots to check on his arm and face that itch.    The following portions of the chart were reviewed this encounter and updated as appropriate: medications, allergies, medical history  Review of Systems:  No other skin or systemic complaints except as noted in HPI or Assessment and Plan.  Objective  Well appearing patient in no apparent distress; mood and affect are within normal limits.  All skin waist up examined. Relevant physical exam findings are noted in the Assessment and Plan.    Assessment & Plan   INFLAMED SEBORRHEIC KERATOSIS (2) L lateral cheek x 1, R upper arm x 1 (2) Symptomatic, irritating, patient would like treated. Destruction of lesion - L lateral cheek x 1, R upper arm x 1 (2)  Destruction method: cryotherapy   Informed consent: discussed and consent obtained   Lesion destroyed using liquid nitrogen: Yes   Region frozen until ice ball extended beyond lesion: Yes   Outcome: patient tolerated procedure well with no complications   Post-procedure details: wound care instructions given   Additional details:  Prior to procedure, discussed risks of blister formation, small wound, skin dyspigmentation, or rare scar following cryotherapy. Recommend Vaseline ointment to treated areas while healing.   Skin cancer screening performed today.  Actinic Damage - Chronic condition, secondary to cumulative UV/sun exposure - diffuse scaly erythematous macules with underlying dyspigmentation - Recommend daily broad spectrum sunscreen SPF 30+ to sun-exposed areas, reapply every 2 hours  as needed.  - Staying in the shade or wearing long sleeves, sun glasses (UVA+UVB protection) and wide brim hats (4-inch brim around the entire circumference of the hat) are also recommended for sun protection.  - Call for new or changing lesions.  Lentigines, Seborrheic Keratoses, Hemangiomas - Benign normal skin lesions - Benign-appearing - Call for any changes  Melanocytic Nevi - Tan-brown and/or pink-flesh-colored symmetric macules and papules - Benign appearing on exam today - Observation - Call clinic for new or changing moles - Recommend daily use of broad spectrum spf 30+ sunscreen to sun-exposed areas.   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  - Call if any new or changing lesions are noted between office visits  LENTIGO VS SK Exam: tan macule 1.0 cm right temple Due to sun exposure Treatment Plan: Benign-appearing, observe. Recommend daily broad spectrum sunscreen SPF 30+ to sun-exposed areas, reapply every 2 hours as needed.  Call for any changes  EXCORIATION Exam: Excoriation at right upper lip below nose  Treatment Plan: Irritated from  shaving this AM. Recommend vaseline. Call if not resolving.   Return in about 6 months (around 02/20/2024) for AKs.  ICherlyn Labella, CMA, am acting as scribe for Willeen Niece, MD .   Documentation: I have reviewed the above documentation for accuracy and completeness, and I agree  with the above.  Willeen Niece, MD

## 2023-08-23 NOTE — Patient Instructions (Addendum)

## 2023-11-15 ENCOUNTER — Ambulatory Visit: Admitting: Urology

## 2023-11-15 ENCOUNTER — Encounter: Payer: Self-pay | Admitting: Urology

## 2023-11-15 VITALS — BP 157/83 | HR 97 | Ht 72.0 in | Wt 200.0 lb

## 2023-11-15 DIAGNOSIS — R399 Unspecified symptoms and signs involving the genitourinary system: Secondary | ICD-10-CM

## 2023-11-15 DIAGNOSIS — C61 Malignant neoplasm of prostate: Secondary | ICD-10-CM

## 2023-11-15 NOTE — Progress Notes (Signed)
 I, Trevor Carlson, acting as a Neurosurgeon for Trevor Knapp, MD., have documented all relevant documentation on the behalf of Trevor Knapp, MD, as directed by Trevor Knapp, MD while in the presence of Trevor Knapp, MD.  Discussed the use of AI scribe software for clinical note transcription with the patient, who gave verbal consent to proceed.   11/15/2023 2:29 PM   Deforest P Carlson 03-27-1935 [id number]  Referring provider: Antonio Baumgarten, MD [address] [facility] [place],  [place] [address]  Chief Complaint  Patient presents with   New Patient (Initial Visit)    HPI: Trevor Carlson is a 89 y.o. male presents for evaluation of an elevated PSA. His son-in-law was with him today.   Status post-radical prostatectomy in [place] in 1998 EBRT 18 months post-op for rising PSA. Recent PSA April 2025 was 4.26; had slowly increased from a level of 0.70 in March 2016 on review of available records. He has had some decreased force and caliber of his urinary stream and complains of urinary incontinence when walking.   PMH: Past Medical History:  Diagnosis Date   Actinic keratosis    Arthritis    osteoarthritis right knee   Basal cell carcinoma 06/2010   L nasal dorsum/med canthus, txted by Dr. Debrah Fan, Mohs   History of hiatal hernia    Hypertension    Pre-syncope    Prostate cancer (HCC)    Squamous cell carcinoma of skin 11/2012   SCC IS L ear concha   Squamous cell carcinoma of skin 05/25/2020   L post ear, EDC   Urinary incontinence    Wears partial dentures     Surgical History: Past Surgical History:  Procedure Laterality Date   CATARACT EXTRACTION W/PHACO Right 02/09/2021   Procedure: CATARACT EXTRACTION PHACO AND INTRAOCULAR LENS PLACEMENT (IOC) RIGHT 9.25 00:50.2;  Surgeon: Rosa College, MD;  Location: [facility];  Service: Ophthalmology;  Laterality: Right;   CATARACT EXTRACTION W/PHACO Left 02/22/2021   Procedure: CATARACT  EXTRACTION PHACO AND INTRAOCULAR LENS PLACEMENT (IOC) LEFT 7.42 00:44.1;  Surgeon: Rosa College, MD;  Location: [facility];  Service: Ophthalmology;  Laterality: Left;   COLONOSCOPY  2015   dental implants     ESOPHAGOGASTRODUODENOSCOPY (EGD) WITH PROPOFOL  N/A 12/06/2016   Procedure: ESOPHAGOGASTRODUODENOSCOPY (EGD) WITH PROPOFOL ;  Surgeon: Deveron Fly, MD;  Location: [facility];  Service: Endoscopy;  Laterality: N/A;   ESOPHAGOGASTRODUODENOSCOPY (EGD) WITH PROPOFOL  N/A 01/10/2017   Procedure: ESOPHAGOGASTRODUODENOSCOPY (EGD) WITH PROPOFOL ;  Surgeon: Deveron Fly, MD;  Location: [facility] END[facility]COPY;  Service: Endoscopy;  Laterality: N/A;   PROSTATE SURGERY      Home Medications:  Allergies as of 11/15/2023   No Known Allergies      Medication List        Accurate as of Nov 15, 2023  2:29 PM. If you have any questions, ask your nurse or doctor.          STOP taking these medications    albuterol  108 (90 Base) MCG/ACT inhaler Commonly known as: VENTOLIN  HFA Stopped by: Trevor Carlson   azithromycin  250 MG tablet Commonly known as: ZITHROMAX  Stopped by: Trevor Carlson   naproxen sodium 220 MG tablet Commonly known as: ALEVE Stopped by: Dion Parrow C Sirus Labrie   ondansetron  4 MG disintegrating tablet Commonly known as: ZOFRAN -ODT Stopped by: Trevor Carlson   polyethylene glycol 17 g packet Commonly known as: MIRALAX  / GLYCOLAX  Stopped by:  Noemy Hallmon C Clayten Allcock   predniSONE  10 MG tablet Commonly known as: DELTASONE  Stopped by: Trevor Carlson       TAKE these medications    ascorbic acid  500 MG tablet Commonly known as: VITAMIN C Take 500 mg by mouth daily.   aspirin EC 81 MG tablet Take 81 mg by mouth daily.   calcium  citrate-vitamin D  315-200 MG-UNIT tablet Commonly known as: CITRACAL+D Take 1 tablet by mouth 2 (two) times daily.   CHOLESTOFF PLUS PO Take 2 tablets by mouth 2 (two) times daily.   Co Q-10 100 MG Caps Take 1 capsule by  mouth daily.   cyanocobalamin 1000 MCG tablet Commonly known as: VITAMIN B12 Take 1,000 mcg by mouth daily.   glucosamine-chondroitin 500-400 MG tablet Take 1 tablet by mouth 2 (two) times daily.   losartan  50 MG tablet Commonly known as: COZAAR  Take 50 mg by mouth daily.   multivitamin tablet Take 1 tablet by mouth daily.   OMEGA-3 FATTY ACIDS PO Take 1,000 mg by mouth daily.   omeprazole 40 MG capsule Commonly known as: PRILOSEC Take 40 mg by mouth daily.        Allergies: No Known Allergies  Social History:  reports that he quit smoking about 49 years ago. His smoking use included cigarettes. He started smoking about 84 years ago. He has a 35 pack-year smoking history. He quit smokeless tobacco use about 44 years ago. He reports that he does not drink alcohol and does not use drugs.   Physical Exam: BP (!) 157/83   Pulse 97   Ht 6' (1.829 m)   Wt 200 lb (90.7 kg)   BMI 27.12 kg/m   Constitutional:  Alert and oriented, No acute distress. HEENT: [place] AT Respiratory: Normal respiratory effort, no increased work of breathing. GU: Rectal exam with non-palpable prostate with smooth anterior rectal wall. No nodularity or mass appreciated. Psychiatric: Normal mood and affect.   Assessment & Plan:    1. Prostate cancer Status post-radical prostatectomy 1998 and EBRT for rising PSA 18 months post-op We discussed his rising PSA is consistent with recurrent disease.  Schedule PSMA/PET for further evaluation.  We discussed potential treatment options including ADT.   2. Lower urinary tract symptoms Stress incontinence with slightly worsening symptoms and decreased force and caliber of urinary stream. No effective medications for his particular symptoms. If urinary stream worsens, consider cystoscopy.  I have reviewed the above documentation for accuracy and completeness, and I agree with the above.   Trevor Knapp, MD  [facility] [address], [address] [place], [place] [address] [phone]

## 2023-11-15 NOTE — Patient Instructions (Signed)
 May call to schedule. [phone].

## 2023-12-04 ENCOUNTER — Ambulatory Visit
Admission: RE | Admit: 2023-12-04 | Discharge: 2023-12-04 | Disposition: A | Discharge status: Home / Self Care | Source: Ambulatory Visit | Attending: Urology | Admitting: Urology

## 2023-12-04 DIAGNOSIS — C61 Malignant neoplasm of prostate: Secondary | ICD-10-CM | POA: Diagnosis not present

## 2023-12-04 DIAGNOSIS — Z923 Personal history of irradiation: Secondary | ICD-10-CM | POA: Insufficient documentation

## 2023-12-04 DIAGNOSIS — R918 Other nonspecific abnormal finding of lung field: Secondary | ICD-10-CM | POA: Insufficient documentation

## 2023-12-04 DIAGNOSIS — R972 Elevated prostate specific antigen [PSA]: Secondary | ICD-10-CM | POA: Diagnosis present

## 2023-12-04 MED ORDER — FLOTUFOLASTAT F 18 GALLIUM 296-5846 MBQ/ML IV SOLN
8.0000 | Freq: Once | INTRAVENOUS | Status: AC
  Administered 2023-12-04: 8.54 via INTRAVENOUS
  Filled 2023-12-04: qty 8

## 2023-12-07 ENCOUNTER — Ambulatory Visit: Payer: Self-pay | Admitting: Urology

## 2023-12-15 ENCOUNTER — Encounter: Payer: Self-pay | Admitting: Urology

## 2023-12-15 ENCOUNTER — Ambulatory Visit: Admitting: Urology

## 2023-12-15 VITALS — BP 130/87 | HR 91 | Ht 72.0 in | Wt 200.0 lb

## 2023-12-15 DIAGNOSIS — R399 Unspecified symptoms and signs involving the genitourinary system: Secondary | ICD-10-CM | POA: Diagnosis not present

## 2023-12-15 DIAGNOSIS — C61 Malignant neoplasm of prostate: Secondary | ICD-10-CM

## 2023-12-15 NOTE — Progress Notes (Signed)
 12/15/2023 1:00 PM   Trevor Carlson Jul 29, 1934 [id number]  Referring provider: Antonio Baumgarten, MD [address] [facility] [place],  [place] [address]  Chief Complaint  Patient presents with   Follow-up    HPI: Trevor Carlson is a 89 y.o. male presents for evaluation of an elevated PSA. His daughter was with him today.   Initially seen 11/15/2023 Status post-radical prostatectomy in [place] in 1998 EBRT 18 months post-op for rising PSA. Recent PSA April 2025 was 4.26; had slowly increased from a level of 0.70 in March 2016 on review of available records. He has had some decreased force and caliber of his urinary stream and complains of urinary incontinence when walking. PSMA/PET performed 12/04/2023 showed no activity in the pelvis; no adenopathy, visceral mets or skeletal mets identified   PMH: Past Medical History:  Diagnosis Date   Actinic keratosis    Arthritis    osteoarthritis right knee   Basal cell carcinoma 06/2010   L nasal dorsum/med canthus, txted by Dr. Debrah Fan, Mohs   History of hiatal hernia    Hypertension    Pre-syncope    Prostate cancer (HCC)    Squamous cell carcinoma of skin 11/2012   SCC IS L ear concha   Squamous cell carcinoma of skin 05/25/2020   L post ear, EDC   Urinary incontinence    Wears partial dentures     Surgical History: Past Surgical History:  Procedure Laterality Date   CATARACT EXTRACTION W/PHACO Right 02/09/2021   Procedure: CATARACT EXTRACTION PHACO AND INTRAOCULAR LENS PLACEMENT (IOC) RIGHT 9.25 00:50.2;  Surgeon: Rosa College, MD;  Location: [facility];  Service: Ophthalmology;  Laterality: Right;   CATARACT EXTRACTION W/PHACO Left 02/22/2021   Procedure: CATARACT EXTRACTION PHACO AND INTRAOCULAR LENS PLACEMENT (IOC) LEFT 7.42 00:44.1;  Surgeon: Rosa College, MD;  Location: [facility];  Service: Ophthalmology;  Laterality: Left;   COLONOSCOPY  2015   dental implants      ESOPHAGOGASTRODUODENOSCOPY (EGD) WITH PROPOFOL  N/A 12/06/2016   Procedure: ESOPHAGOGASTRODUODENOSCOPY (EGD) WITH PROPOFOL ;  Surgeon: Deveron Fly, MD;  Location: [facility] END[facility];  Service: Endoscopy;  Laterality: N/A;   ESOPHAGOGASTRODUODENOSCOPY (EGD) WITH PROPOFOL  N/A 01/10/2017   Procedure: ESOPHAGOGASTRODUODENOSCOPY (EGD) WITH PROPOFOL ;  Surgeon: Deveron Fly, MD;  Location: [facility] ENDOSCOPY;  Service: Endoscopy;  Laterality: N/A;   PROSTATE SURGERY      Home Medications:  Allergies as of 12/15/2023   No Known Allergies      Medication List        Accurate as of December 15, 2023  1:00 PM. If you have any questions, ask your nurse or doctor.          ascorbic acid  500 MG tablet Commonly known as: VITAMIN C Take 500 mg by mouth daily.   aspirin EC 81 MG tablet Take 81 mg by mouth daily.   calcium  citrate-vitamin D  315-200 MG-UNIT tablet Commonly known as: CITRACAL+D Take 1 tablet by mouth 2 (two) times daily.   CHOLESTOFF PLUS PO Take 2 tablets by mouth 2 (two) times daily.   Co Q-10 100 MG Caps Take 1 capsule by mouth daily.   cyanocobalamin 1000 MCG tablet Commonly known as: VITAMIN B12 Take 1,000 mcg by mouth daily.   glucosamine-chondroitin 500-400 MG tablet Take 1 tablet by mouth 2 (two) times daily.   losartan  50 MG tablet Commonly known as: COZAAR  Take 50 mg by mouth daily.   multivitamin tablet Take 1 tablet by  mouth daily.   OMEGA-3 FATTY ACIDS PO Take 1,000 mg by mouth daily.   omeprazole 40 MG capsule Commonly known as: PRILOSEC Take 40 mg by mouth daily.        Allergies: No Known Allergies  Social History:  reports that he quit smoking about 49 years ago. His smoking use included cigarettes. He started smoking about 84 years ago. He has a 35 pack-year smoking history. He quit smokeless tobacco use about 44 years ago. He reports that he does not drink alcohol and does not use drugs.   Physical Exam: BP 130/87   Pulse 91   Ht 6'  (1.829 m)   Wt 200 lb (90.7 kg)   BMI 27.12 kg/m   Constitutional:  Alert, No acute distress. HEENT: [place] AT Respiratory: Normal respiratory effort, no increased work of breathing. Psychiatric: Normal mood and affect.   Assessment & Plan:    1. Prostate cancer PSMA/PET results were discussed in detail.  Although no activity was identified suspicious for recurrent disease based on his rising PSA level we discussed he most likely has microscopic disease recurrence currently not detectable He is active and based on PET results did not recommend ADT Follow-up PSA 6 months which she will have with Dr. Kevan Peers. 1 year follow-up with PSA in urology  2. Lower urinary tract symptoms We discussed the only effective treatment for his stress urinary incontinence would be surgery.  He does not desire to pursue If a small urethral stricture was identified and was dilated this may worsen his urinary incontinence.  His voiding symptoms have been stable for many years   Geraline Knapp, MD  [facility] [address] [place], [place] [address] [phone]

## 2024-02-13 ENCOUNTER — Emergency Department
Admission: EM | Admit: 2024-02-13 | Discharge: 2024-02-13 | Disposition: A | Discharge status: Home / Self Care | Attending: Emergency Medicine | Admitting: Emergency Medicine

## 2024-02-13 ENCOUNTER — Other Ambulatory Visit: Payer: Self-pay

## 2024-02-13 ENCOUNTER — Emergency Department

## 2024-02-13 DIAGNOSIS — R0602 Shortness of breath: Secondary | ICD-10-CM | POA: Diagnosis present

## 2024-02-13 DIAGNOSIS — I10 Essential (primary) hypertension: Secondary | ICD-10-CM | POA: Diagnosis not present

## 2024-02-13 DIAGNOSIS — Z8546 Personal history of malignant neoplasm of prostate: Secondary | ICD-10-CM | POA: Diagnosis not present

## 2024-02-13 DIAGNOSIS — J441 Chronic obstructive pulmonary disease with (acute) exacerbation: Secondary | ICD-10-CM | POA: Insufficient documentation

## 2024-02-13 LAB — CBC WITH DIFFERENTIAL/PLATELET
Abs Immature Granulocytes: 0.01 K/uL (ref 0.00–0.07)
Basophils Absolute: 0.1 K/uL (ref 0.0–0.1)
Basophils Relative: 2 %
Eosinophils Absolute: 0.4 K/uL (ref 0.0–0.5)
Eosinophils Relative: 9 %
HCT: 41.5 % (ref 39.0–52.0)
Hemoglobin: 14 g/dL (ref 13.0–17.0)
Immature Granulocytes: 0 %
Lymphocytes Relative: 36 %
Lymphs Abs: 1.8 K/uL (ref 0.7–4.0)
MCH: 29.9 pg (ref 26.0–34.0)
MCHC: 33.7 g/dL (ref 30.0–36.0)
MCV: 88.7 fL (ref 80.0–100.0)
Monocytes Absolute: 0.5 K/uL (ref 0.1–1.0)
Monocytes Relative: 10 %
Neutro Abs: 2.1 K/uL (ref 1.7–7.7)
Neutrophils Relative %: 43 %
Platelets: 177 K/uL (ref 150–400)
RBC: 4.68 MIL/uL (ref 4.22–5.81)
RDW: 14.8 % (ref 11.5–15.5)
WBC: 5 K/uL (ref 4.0–10.5)
nRBC: 0 % (ref 0.0–0.2)

## 2024-02-13 LAB — BASIC METABOLIC PANEL WITH GFR
Anion gap: 8 (ref 5–15)
BUN: 22 mg/dL (ref 8–23)
CO2: 24 mmol/L (ref 22–32)
Calcium: 8.7 mg/dL — ABNORMAL LOW (ref 8.9–10.3)
Chloride: 107 mmol/L (ref 98–111)
Creatinine, Ser: 1.15 mg/dL (ref 0.61–1.24)
GFR, Estimated: >60 mL/min (ref >60)
Glucose, Bld: 123 mg/dL — ABNORMAL HIGH (ref 70–99)
Potassium: 4.1 mmol/L (ref 3.5–5.1)
Sodium: 139 mmol/L (ref 135–145)

## 2024-02-13 LAB — TROPONIN I (HIGH SENSITIVITY): Troponin I (High Sensitivity): 4 ng/L (ref <18)

## 2024-02-13 MED ORDER — PREDNISONE 20 MG PO TABS: 60.0000 mg | ORAL_TABLET | Freq: Every day | ORAL | 0 refills | Status: AC

## 2024-02-13 MED ORDER — IPRATROPIUM-ALBUTEROL 0.5-2.5 (3) MG/3ML IN SOLN
3.0000 mL | Freq: Once | RESPIRATORY_TRACT | Status: AC
  Administered 2024-02-13: 3 mL via RESPIRATORY_TRACT
  Filled 2024-02-13: qty 3

## 2024-02-13 MED ORDER — PREDNISONE 20 MG PO TABS
60.0000 mg | ORAL_TABLET | Freq: Once | ORAL | Status: AC
  Administered 2024-02-13: 60 mg via ORAL
  Filled 2024-02-13: qty 3

## 2024-02-13 MED ORDER — IPRATROPIUM-ALBUTEROL 0.5-2.5 (3) MG/3ML IN SOLN
3.0000 mL | Freq: Once | RESPIRATORY_TRACT | Status: AC
  Administered 2024-02-13: 3 mL via RESPIRATORY_TRACT

## 2024-02-13 MED ORDER — IPRATROPIUM-ALBUTEROL 0.5-2.5 (3) MG/3ML IN SOLN
RESPIRATORY_TRACT | Status: AC
  Filled 2024-02-13: qty 3

## 2024-02-13 NOTE — ED Triage Notes (Signed)
 Pt reports shortness of breath that began tonight, pt states he has hx COPD, did some breathing treatments at home with no relief. Pt appears visibly short of breath, SOB worse with exertion. Pt denies chest pain.

## 2024-02-13 NOTE — ED Provider Notes (Signed)
 [facility] Provider Note    Event Date/Time   First MD Initiated Contact with Patient 02/13/24 2114     (approximate)   History   Chief Complaint Shortness of Breath   HPI  Trevor Carlson is a 89 y.o. male with past medical history of hypertension, COPD, and prostate cancer who presents to the ED complaining of shortness of breath.  Patient reports that he has had increasing difficulty breathing this evening along with a dry cough.  He denies any fevers and has not had any pain in his chest.  He also denies any pain or swelling in his legs.  He used his nebulizer once at home and took a couple of puffs from his inhaler with no significant relief.  He describes symptoms as similar to prior COPD exacerbations, daughter at bedside reports hearing audible wheezing.     Physical Exam   Triage Vital Signs: ED Triage Vitals  Encounter Vitals Group     BP 02/13/24 2108 139/74     Girls Systolic BP Percentile --      Girls Diastolic BP Percentile --      Boys Systolic BP Percentile --      Boys Diastolic BP Percentile --      Pulse Rate 02/13/24 2108 91     Resp 02/13/24 2108 (!) 24     Temp 02/13/24 2108 97.9 F (36.6 C)     Temp Source 02/13/24 2108 Oral     SpO2 02/13/24 2108 98 %     Weight 02/13/24 2107 200 lb (90.7 kg)     Height 02/13/24 2107 6' (1.829 m)     Head Circumference --      Peak Flow --      Pain Score 02/13/24 2107 0     Pain Loc --      Pain Education --      Exclude from Growth Chart --     Most recent vital signs: Vitals:   02/13/24 2108  BP: 139/74  Pulse: 91  Resp: (!) 24  Temp: 97.9 F (36.6 C)  SpO2: 98%    Constitutional: Alert and oriented. Eyes: Conjunctivae are normal. Head: Atraumatic. Nose: No congestion/rhinnorhea. Mouth/Throat: Mucous membranes are moist.  Cardiovascular: Normal rate, regular rhythm. Grossly normal heart sounds.  2+ radial pulses bilaterally. Respiratory: Mildly increased respiratory  effort, no retractions noted.  Inspiratory and expiratory wheezing throughout. Gastrointestinal: Soft and nontender. No distention. Musculoskeletal: No lower extremity tenderness nor edema.  Neurologic:  Normal speech and language. No gross focal neurologic deficits are appreciated.    ED Results / Procedures / Treatments   Labs (all labs ordered are listed, but only abnormal results are displayed) Labs Reviewed  BASIC METABOLIC PANEL WITH GFR - Abnormal; Notable for the following components:      Result Value   Glucose, Bld 123 (*)    Calcium  8.7 (*)    All other components within normal limits  CBC WITH DIFFERENTIAL/PLATELET  TROPONIN I (HIGH SENSITIVITY)     EKG  ED ECG REPORT I, Carlin Palin, the attending physician, personally viewed and interpreted this ECG.   Date: 02/13/2024  EKG Time: 21:09  Rate: 89  Rhythm: normal sinus rhythm  Axis: Normal  Intervals:none  ST&T Change: None  RADIOLOGY Chest x-ray reviewed and interpreted by me with no infiltrate, edema, or effusion.  PROCEDURES:  Critical Care performed: No  Procedures   MEDICATIONS ORDERED IN ED: Medications  predniSONE  (DELTASONE ) tablet 60  mg (60 mg Oral Given 02/13/24 2141)  ipratropium-albuterol  (DUONEB) 0.5-2.5 (3) MG/3ML nebulizer solution 3 mL (3 mLs Nebulization Given 02/13/24 2141)  ipratropium-albuterol  (DUONEB) 0.5-2.5 (3) MG/3ML nebulizer solution 3 mL (3 mLs Nebulization Given 02/13/24 2233)     IMPRESSION / MDM / ASSESSMENT AND PLAN / ED COURSE  I reviewed the triage vital signs and the nursing notes.                              89 y.o. male with past medical history of hypertension, COPD, and prostate cancer who presents to the ED with dry cough and increasing difficulty breathing this evening.  Patient's presentation is most consistent with acute presentation with potential threat to life or bodily function.  Differential diagnosis includes, but is not limited to, COPD  exacerbation, ACS, PE, CHF, anemia, electrolyte abnormality, AKI.  Patient nontoxic-appearing and in no acute distress, vital signs remarkable for mild tachypnea but otherwise reassuring.  Patient is not in any respiratory distress and maintaining oxygen saturations at 98% on room air.  He does have significant wheezing and will treat with prednisone  and DuoNeb.  EKG shows no evidence of arrhythmia or ischemia, low suspicion for ACS or PE.  Labs and chest x-ray are pending.  Chest x-ray is unremarkable, labs are reassuring with no significant anemia, leukocytosis, electrolyte abnormality, or AKI.  Troponin within normal limits and I doubt ACS or PE.  Wheezing significantly improved and almost resolved following 2 DuoNebs and prednisone .  He is breathing comfortably on room air and appropriate for discharge home with outpatient follow-up.  We will prescribe course of prednisone  and he reports having plenty of albuterol  for his nebulizer at home.  He was counseled to return to the ED for new or worsening symptoms, patient and daughter agree with plan.      FINAL CLINICAL IMPRESSION(S) / ED DIAGNOSES   Final diagnoses:  COPD exacerbation (HCC)     Rx / DC Orders   ED Discharge Orders          Ordered    predniSONE  (DELTASONE ) 20 MG tablet  Daily with breakfast        02/13/24 2252             Note:  This document was prepared using Dragon voice recognition software and may include unintentional dictation errors.   Willo Dunnings, MD 02/13/24 [id number]

## 2024-03-05 ENCOUNTER — Encounter: Payer: Self-pay | Admitting: Dermatology

## 2024-03-05 ENCOUNTER — Ambulatory Visit: Payer: Medicare Other | Admitting: Dermatology

## 2024-03-05 DIAGNOSIS — Z7189 Other specified counseling: Secondary | ICD-10-CM

## 2024-03-05 DIAGNOSIS — L814 Other melanin hyperpigmentation: Secondary | ICD-10-CM | POA: Diagnosis not present

## 2024-03-05 DIAGNOSIS — L821 Other seborrheic keratosis: Secondary | ICD-10-CM | POA: Diagnosis not present

## 2024-03-05 DIAGNOSIS — B078 Other viral warts: Secondary | ICD-10-CM | POA: Diagnosis not present

## 2024-03-05 DIAGNOSIS — L570 Actinic keratosis: Secondary | ICD-10-CM

## 2024-03-05 DIAGNOSIS — L578 Other skin changes due to chronic exposure to nonionizing radiation: Secondary | ICD-10-CM

## 2024-03-05 DIAGNOSIS — W908XXA Exposure to other nonionizing radiation, initial encounter: Secondary | ICD-10-CM | POA: Diagnosis not present

## 2024-03-05 DIAGNOSIS — Z85828 Personal history of other malignant neoplasm of skin: Secondary | ICD-10-CM

## 2024-03-05 NOTE — Patient Instructions (Addendum)
 Cryotherapy Aftercare  Wash gently with soap and water everyday.   Apply Vaseline Jelly daily until healed.     Recommend daily broad spectrum sunscreen SPF 30+ to sun-exposed areas, reapply every 2 hours as needed. Call for new or changing lesions.  Staying in the shade or wearing long sleeves, sun glasses (UVA+UVB protection) and wide brim hats (4-inch brim around the entire circumference of the hat) are also recommended for sun protection.      Due to recent changes in healthcare laws, you may see results of your pathology and/or laboratory studies on MyChart before the doctors have had a chance to review them. We understand that in some cases there may be results that are confusing or concerning to you. Please understand that not all results are received at the same time and often the doctors may need to interpret multiple results in order to provide you with the best plan of care or course of treatment. Therefore, we ask that you please give us  2 business days to thoroughly review all your results before contacting the office for clarification. Should we see a critical lab result, you will be contacted sooner.   If You Need Anything After Your Visit  If you have any questions or concerns for your doctor, please call our main line at [phone] and press option 4 to reach your doctor's medical assistant. If no one answers, please leave a voicemail as directed and we will return your call as soon as possible. Messages left after 4 pm will be answered the following business day.   You may also send us  a message via MyChart. We typically respond to MyChart messages within 1-2 business days.  For prescription refills, please ask your pharmacy to contact our office. Our fax number is [phone].  If you have an urgent issue when the clinic is closed that cannot wait until the next business day, you can page your doctor at the number below.    Please note that while we do our best to be  available for urgent issues outside of office hours, we are not available 24/7.   If you have an urgent issue and are unable to reach us , you may choose to seek medical care at your doctor's office, retail clinic, urgent care center, or emergency room.  If you have a medical emergency, please immediately call 911 or go to the emergency department.  Pager Numbers  - Dr. Hester: [phone]  - Dr. Jackquline: [phone]  - Dr. Claudene: [phone]   - Dr. Raymund: [phone]  In the event of inclement weather, please call our main line at [phone] for an update on the status of any delays or closures.  Dermatology Medication Tips: Please keep the boxes that topical medications come in in order to help keep track of the instructions about where and how to use these. Pharmacies typically print the medication instructions only on the boxes and not directly on the medication tubes.   If your medication is too expensive, please contact our office at [phone] option 4 or send us  a message through MyChart.   We are unable to tell what your co-pay for medications will be in advance as this is different depending on your insurance coverage. However, we may be able to find a substitute medication at lower cost or fill out paperwork to get insurance to cover a needed medication.   If a prior authorization is required to get your medication covered by your insurance company, please allow us   1-2 business days to complete this process.  Drug prices often vary depending on where the prescription is filled and some pharmacies may offer cheaper prices.  The website www.goodrx.com contains coupons for medications through different pharmacies. The prices here do not account for what the cost may be with help from insurance (it may be cheaper with your insurance), but the website can give you the price if you did not use any insurance.  - You can print the associated coupon and take it with your  prescription to the pharmacy.  - You may also stop by our office during regular business hours and pick up a GoodRx coupon card.  - If you need your prescription sent electronically to a different pharmacy, notify our office through [facility] or by phone at [phone] option 4.     Si Usted Necesita Algo Despus de Su Visita  Tambin puede enviarnos un mensaje a travs de Clinical cytogeneticist. Por lo general respondemos a los mensajes de MyChart en el transcurso de 1 a 2 das hbiles.  Para renovar recetas, por favor pida a su farmacia que se ponga en contacto con nuestra oficina. Randi lakes de fax es [place] [phone].  Si tiene un asunto urgente cuando la clnica est cerrada y que no puede esperar hasta el siguiente da hbil, puede llamar/localizar a su doctor(a) al nmero que aparece a continuacin.   Por favor, tenga en cuenta que aunque hacemos todo lo posible para estar disponibles para asuntos urgentes fuera del horario de [place], no estamos disponibles las 24 horas del da, los 7 [address]  [address] de la [place].   Si tiene un problema urgente y no puede comunicarse con nosotros, puede optar por buscar atencin mdica  en el consultorio de su doctor(a), en una clnica privada, en un centro de atencin urgente o en una sala de emergencias.  Si tiene Engineer, drilling, por favor llame inmediatamente al 911 o vaya a la sala de emergencias.  Nmeros de bper  - Dr. Hester: [phone]  - Dra. Jackquline: [phone]  - Dr. Claudene: [phone]  - Dra. Kitts: [phone]  En caso de inclemencias del [place], por favor llame a nuestra lnea principal al [phone] para una actualizacin sobre el estado de cualquier retraso o cierre.  Consejos para la medicacin en dermatologa: Por favor, guarde las cajas en las que vienen los medicamentos de uso tpico para ayudarle a seguir las instrucciones sobre dnde y cmo usarlos. Las farmacias generalmente imprimen las instrucciones del  medicamento slo en las cajas y no directamente en los tubos del [place].   Si su medicamento es muy caro, por favor, pngase en contacto con landry rieger llamando al [phone] y presione la opcin 4 o envenos un mensaje a travs de Clinical cytogeneticist.   No podemos decirle cul ser su copago por los medicamentos por adelantado ya que esto es diferente dependiendo de la cobertura de su seguro. Sin embargo, es posible que podamos encontrar un medicamento sustituto a Audiological scientist un formulario para que el seguro cubra el medicamento que se considera necesario.   Si se requiere una autorizacin previa para que su compaa de seguros malta su medicamento, por favor permtanos de 1 a 2 das hbiles para completar este proceso.  Los precios de los medicamentos varan con frecuencia dependiendo del Environmental consultant de dnde se surte la receta y alguna farmacias pueden ofrecer precios ms baratos.  El sitio web www.goodrx.com tiene cupones para medicamentos de Health and safety inspector. Los precios aqu no tienen en  cuenta lo que podra costar con la ayuda del seguro (puede ser ms barato con su seguro), pero el sitio web puede darle el precio si no Visual merchandiser.  - Puede imprimir el cupn correspondiente y llevarlo con su receta a la farmacia.  - Tambin puede pasar por nuestra oficina durante el horario de atencin regular y Education officer, museum una tarjeta de cupones de GoodRx.  - Si necesita que su receta se enve electrnicamente a una farmacia diferente, informe a nuestra oficina a travs de MyChart de [place] o por telfono llamando al [phone] y presione la opcin 4.

## 2024-03-05 NOTE — Progress Notes (Signed)
 Follow-Up Visit   Subjective  Trevor Carlson is a 89 y.o. male who presents for the following: Sun exposed exam. Hx of AKs. Hx of BCC. Hx of SCCs.   C/O wart at left elbow. Raised, rough.   The patient has spots, moles and lesions to be evaluated, some may be new or changing and the patient may have concern these could be cancer.  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    The following portions of the chart were reviewed this encounter and updated as appropriate: medications, allergies, medical history  Review of Systems:  No other skin or systemic complaints except as noted in HPI or Assessment and Plan.  Objective  Well appearing patient in no apparent distress; mood and affect are within normal limits.  A focused examination was performed of the following areas: Scalp, face, ears, neck, arms, hands, upper mid chest  Relevant physical exam findings are noted in the Assessment and Plan.  Left Elbow - Posterior x1 Verrucous papules -- Discussed viral etiology and contagion.  Left Posterior Ear Helix x1, R infra ocular x1, L post earlobe x1, R lat cheek x1 (4) Erythematous thin papules/macules with gritty scale.   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  - Call if any new or changing lesions are noted between office visits  ACTINIC DAMAGE - chronic, secondary to cumulative UV radiation  exposure/sun exposure over time - diffuse scaly erythematous macules with underlying dyspigmentation - Recommend daily broad spectrum sunscreen SPF 30+ to sun-exposed areas, reapply every 2 hours as needed.  - Recommend staying in the shade or wearing long sleeves, sun glasses (UVA+UVB protection) and wide brim hats (4-inch brim around the entire circumference of the hat). - Call for new or changing lesions.   LENTIGO VS SK Exam: tan macule 0.8 cm right temple Due to sun exposure Treatment Plan: Benign-appearing, observe. Recommend daily broad spectrum sunscreen SPF 30+ to sun-exposed areas, reapply every 2 hours as needed.  Call for any changes  SEBORRHEIC KERATOSIS - Stuck-on, waxy, tan-brown papules temples - Benign-appearing - Discussed benign etiology and prognosis. - Observe - Call for any changes   OTHER VIRAL WARTS Left Elbow - Posterior x1 Vs ISK  Viral Wart (HPV) Counseling  Discussed viral / HPV (Human Papilloma Virus) etiology and risk of spread /infectivity to other areas of body as well as to other people.  Multiple treatments and methods may be required to clear warts and it is possible treatment may not be successful.  Treatment risks include discoloration; scarring and there is still potential for wart recurrence. Destruction of lesion - Left Elbow - Posterior x1  Destruction method: cryotherapy   Informed consent: discussed and consent obtained   Lesion destroyed using liquid nitrogen: Yes   Region frozen until ice ball extended beyond lesion: Yes   Outcome: patient tolerated procedure well with no complications  Post-procedure details: wound care instructions given   Additional details:  Prior to procedure, discussed risks of blister formation, small wound, skin dyspigmentation, or rare scar following cryotherapy. Recommend Vaseline ointment to treated areas while healing.   AK (ACTINIC KERATOSIS) (4) Left Posterior Ear Helix x1, R infra ocular x1, L post  earlobe x1, R lat cheek x1 (4) Actinic keratoses are precancerous spots that appear secondary to cumulative UV radiation exposure/sun exposure over time. They are chronic with expected duration over 1 year. A portion of actinic keratoses will progress to squamous cell carcinoma of the skin. It is not possible to reliably predict which spots will progress to skin cancer and so treatment is recommended to prevent development of skin cancer.  Recommend daily broad spectrum sunscreen SPF 30+ to sun-exposed areas, reapply every 2 hours as needed.  Recommend staying in the shade or wearing long sleeves, sun glasses (UVA+UVB protection) and wide brim hats (4-inch brim around the entire circumference of the hat). Call for new or changing lesions. Destruction of lesion - Left Posterior Ear Helix x1, R infra ocular x1, L post earlobe x1, R lat cheek x1 (4)  Destruction method: cryotherapy   Informed consent: discussed and consent obtained   Lesion destroyed using liquid nitrogen: Yes   Region frozen until ice ball extended beyond lesion: Yes   Outcome: patient tolerated procedure well with no complications   Post-procedure details: wound care instructions given   Additional details:  Prior to procedure, discussed risks of blister formation, small wound, skin dyspigmentation, or rare scar following cryotherapy. Recommend Vaseline ointment to treated areas while healing.     Return in about 6 months (around 09/02/2024) for UBSE, HxAKs, HxBCC, HxSCC.  I, Jill Parcell, CMA, am acting as scribe for Rexene Rattler, MD.   Documentation: I have reviewed the above documentation for accuracy and completeness, and I agree with the above.  Rexene Rattler, MD

## 2024-04-08 ENCOUNTER — Ambulatory Visit

## 2024-04-08 DIAGNOSIS — Z719 Counseling, unspecified: Secondary | ICD-10-CM

## 2024-04-08 DIAGNOSIS — Z23 Encounter for immunization: Secondary | ICD-10-CM | POA: Diagnosis not present

## 2024-04-08 NOTE — Progress Notes (Signed)
 In nurse clinic for immunization High Dose Flu today. Tolerated to L delt. VIS and copy of [facility] provided.

## 2024-04-25 ENCOUNTER — Emergency Department

## 2024-04-25 ENCOUNTER — Emergency Department
Admission: EM | Admit: 2024-04-25 | Discharge: 2024-04-25 | Disposition: A | Discharge status: Home / Self Care | Attending: Emergency Medicine | Admitting: Emergency Medicine

## 2024-04-25 ENCOUNTER — Other Ambulatory Visit: Payer: Self-pay

## 2024-04-25 DIAGNOSIS — J441 Chronic obstructive pulmonary disease with (acute) exacerbation: Secondary | ICD-10-CM | POA: Diagnosis not present

## 2024-04-25 DIAGNOSIS — R0602 Shortness of breath: Secondary | ICD-10-CM | POA: Diagnosis present

## 2024-04-25 LAB — CBC WITH DIFFERENTIAL/PLATELET
Abs Immature Granulocytes: 0.01 K/uL (ref 0.00–0.07)
Basophils Absolute: 0.1 K/uL (ref 0.0–0.1)
Basophils Relative: 1 %
Eosinophils Absolute: 0.3 K/uL (ref 0.0–0.5)
Eosinophils Relative: 6 %
HCT: 42.6 % (ref 39.0–52.0)
Hemoglobin: 14.2 g/dL (ref 13.0–17.0)
Immature Granulocytes: 0 %
Lymphocytes Relative: 40 %
Lymphs Abs: 2.1 K/uL (ref 0.7–4.0)
MCH: 29.8 pg (ref 26.0–34.0)
MCHC: 33.3 g/dL (ref 30.0–36.0)
MCV: 89.5 fL (ref 80.0–100.0)
Monocytes Absolute: 0.5 K/uL (ref 0.1–1.0)
Monocytes Relative: 9 %
Neutro Abs: 2.3 K/uL (ref 1.7–7.7)
Neutrophils Relative %: 44 %
Platelets: 163 K/uL (ref 150–400)
RBC: 4.76 MIL/uL (ref 4.22–5.81)
RDW: 14.6 % (ref 11.5–15.5)
WBC: 5.3 K/uL (ref 4.0–10.5)
nRBC: 0 % (ref 0.0–0.2)

## 2024-04-25 LAB — RESP PANEL BY RT-PCR (RSV, FLU A&B, COVID)  RVPGX2
Influenza A by PCR: NEGATIVE
Influenza B by PCR: NEGATIVE
Resp Syncytial Virus by PCR: NEGATIVE
SARS Coronavirus 2 by RT PCR: NEGATIVE

## 2024-04-25 LAB — COMPREHENSIVE METABOLIC PANEL WITH GFR
ALT: 13 U/L (ref 0–44)
AST: 19 U/L (ref 15–41)
Albumin: 3.7 g/dL (ref 3.5–5.0)
Alkaline Phosphatase: 56 U/L (ref 38–126)
Anion gap: 11 (ref 5–15)
BUN: 21 mg/dL (ref 8–23)
CO2: 23 mmol/L (ref 22–32)
Calcium: 8.7 mg/dL — ABNORMAL LOW (ref 8.9–10.3)
Chloride: 104 mmol/L (ref 98–111)
Creatinine, Ser: 1.18 mg/dL (ref 0.61–1.24)
GFR, Estimated: 60 mL/min — ABNORMAL LOW (ref >60)
Glucose, Bld: 117 mg/dL — ABNORMAL HIGH (ref 70–99)
Potassium: 3.8 mmol/L (ref 3.5–5.1)
Sodium: 138 mmol/L (ref 135–145)
Total Bilirubin: 0.8 mg/dL (ref 0.0–1.2)
Total Protein: 6.7 g/dL (ref 6.5–8.1)

## 2024-04-25 LAB — TROPONIN I (HIGH SENSITIVITY)
Troponin I (High Sensitivity): 5 ng/L (ref <18)
Troponin I (High Sensitivity): 7 ng/L (ref <18)

## 2024-04-25 LAB — BRAIN NATRIURETIC PEPTIDE: B Natriuretic Peptide: 56.6 pg/mL (ref 0.0–100.0)

## 2024-04-25 MED ORDER — PREDNISONE 20 MG PO TABS: 40.0000 mg | ORAL_TABLET | Freq: Every day | ORAL | 0 refills | Status: AC

## 2024-04-25 NOTE — ED Triage Notes (Signed)
 Pt arrives via [facility] from dinner for SOB. Pt felt hot and like he couldn't get a breath of fresh air. Fire dept found pt at 92% SPO2 on RA. EMS heard wheezing and diminished lower breath sounds. Pt got 2 albuterol  treatments and 1 duoneb with EMS and fire dept.. EMS gave 125 of solumedrol. Hx of stage 2 COPD.

## 2024-04-25 NOTE — Discharge Instructions (Signed)
 We are starting you on some steroids to help with your wheezing and shortness of breath continue to do like your breathing treatments at home and return to the ER if develop worsening shortness of breath, cough, fevers or any other concern

## 2024-04-25 NOTE — ED Provider Notes (Signed)
 [facility] Provider Note    Event Date/Time   First MD Initiated Contact with Patient 04/25/24 1855     (approximate)   History   Shortness of Breath   HPI  Trevor Carlson is a 89 y.o. male with COPD who comes in with concerns for shortness of breath.  Patient reports that he was eating food when he had sudden onset of some shortness of breath.  He denies any cough or feeling ill prior to this happening he denies any chest pain.  He went to the fire station and got Solu-Medrol , DuoNeb, albuterol  x 2 with EMS.  He was never hypoxic.  Upon my evaluation he reports resolution of symptoms and he denies any consistent shortness of breath.  He reports having the same thing happen on 02/13/2024 I did review this hospital admission where patient was treated with steroids and he reported that that seemed to help significantly.     Physical Exam   Triage Vital Signs: ED Triage Vitals [04/25/24 1857]  Encounter Vitals Group     BP      Girls Systolic BP Percentile      Girls Diastolic BP Percentile      Boys Systolic BP Percentile      Boys Diastolic BP Percentile      Pulse      Resp      Temp      Temp src      SpO2      Weight 200 lb (90.7 kg)     Height 6' (1.829 m)     Head Circumference      Peak Flow      Pain Score 0     Pain Loc      Pain Education      Exclude from Growth Chart     Most recent vital signs: Vitals:   04/25/24 1901  BP: (!) 151/115  Pulse: 86  Resp: 20  Temp: 97.7 F (36.5 C)  SpO2: 100%     General: Awake, no distress.  CV:  Good peripheral perfusion.  Resp:  Normal effort.  Slight wheeze Abd:  No distention.  Soft and nontender Other:  No swelling in legs.  No calf tenderness   ED Results / Procedures / Treatments   Labs (all labs ordered are listed, but only abnormal results are displayed) Labs Reviewed - No data to display   EKG  My interpretation of EKG:  Normal sinus rate of 87 without any ST  elevation or T wave inversions, normal intervals  RADIOLOGY I have reviewed the xray personally and interpreted no evidence of any pneumonia   PROCEDURES:  Critical Care performed: No  Procedures   MEDICATIONS ORDERED IN ED: Medications - No data to display   IMPRESSION / MDM / ASSESSMENT AND PLAN / ED COURSE  I reviewed the triage vital signs and the nursing notes.   Patient's presentation is most consistent with acute presentation with potential threat to life or bodily function.   Differential includes COVID, pneumonia, COPD exacerbation.  No evidence of swelling in his legs to suggest CHF.  Low suspicion for pulmonary embolism given he has no evidence of DVT and he reports resolution of symptoms with breathing treatment and his wheezing seems most consistent with COPD exacerbation.  He has had similar episode of this a few months ago that resolved with breathing treatments as well.  He already feels significantly improved after treatments from EMS.  BNP is normal troponin is negative CBC reassuring CMP reassuring and chest x-ray evidence of pneumonia.   10:30 PM repeat troponin is negative patient continues deny any shortness of breath he feels comfortable with discharge home and will prescribe a short course of prednisone .  The patient is on the cardiac monitor to evaluate for evidence of arrhythmia and/or significant heart rate changes.      FINAL CLINICAL IMPRESSION(S) / ED DIAGNOSES   Final diagnoses:  COPD exacerbation (HCC)     Rx / DC Orders   ED Discharge Orders     None        Note:  This document was prepared using Dragon voice recognition software and may include unintentional dictation errors.   Ernest Ronal BRAVO, MD 04/25/24 2230

## 2024-08-27 ENCOUNTER — Ambulatory Visit: Admitting: Dermatology

## 2024-09-10 ENCOUNTER — Ambulatory Visit: Admitting: Dermatology

## 2024-11-06 ENCOUNTER — Other Ambulatory Visit

## 2024-11-13 ENCOUNTER — Ambulatory Visit: Admitting: Urology
